# Patient Record
Sex: Female | Born: 1955 | Race: Black or African American | Hispanic: No | Smoking: Never smoker
Health system: Southern US, Community
[De-identification: ages and names within clinical notes are randomized; demographics above are authoritative.]

## PROBLEM LIST (undated history)

## (undated) DIAGNOSIS — I3139 Other pericardial effusion (noninflammatory): Secondary | ICD-10-CM

## (undated) DIAGNOSIS — E7800 Pure hypercholesterolemia, unspecified: Secondary | ICD-10-CM

## (undated) DIAGNOSIS — I2699 Other pulmonary embolism without acute cor pulmonale: Secondary | ICD-10-CM

## (undated) DIAGNOSIS — Z950 Presence of cardiac pacemaker: Secondary | ICD-10-CM

## (undated) DIAGNOSIS — G43909 Migraine, unspecified, not intractable, without status migrainosus: Secondary | ICD-10-CM

## (undated) DIAGNOSIS — M1990 Unspecified osteoarthritis, unspecified site: Secondary | ICD-10-CM

## (undated) DIAGNOSIS — J189 Pneumonia, unspecified organism: Secondary | ICD-10-CM

## (undated) DIAGNOSIS — F419 Anxiety disorder, unspecified: Secondary | ICD-10-CM

## (undated) DIAGNOSIS — E119 Type 2 diabetes mellitus without complications: Secondary | ICD-10-CM

## (undated) DIAGNOSIS — J45909 Unspecified asthma, uncomplicated: Secondary | ICD-10-CM

## (undated) DIAGNOSIS — R001 Bradycardia, unspecified: Secondary | ICD-10-CM

## (undated) DIAGNOSIS — I10 Essential (primary) hypertension: Secondary | ICD-10-CM

## (undated) DIAGNOSIS — I313 Pericardial effusion (noninflammatory): Secondary | ICD-10-CM

## (undated) HISTORY — PX: ABDOMINAL HYSTERECTOMY: SHX81

## (undated) HISTORY — PX: TONSILLECTOMY: SUR1361

## (undated) HISTORY — PX: JOINT REPLACEMENT: SHX530

## (undated) HISTORY — PX: TOTAL KNEE ARTHROPLASTY: SHX125

---

## 1981-12-21 HISTORY — PX: TUBAL LIGATION: SHX77

## 2010-12-21 HISTORY — PX: MULTIPLE TOOTH EXTRACTIONS: SHX2053

## 2013-12-21 HISTORY — PX: INSERT / REPLACE / REMOVE PACEMAKER: SUR710

## 2013-12-21 HISTORY — PX: PERICARDIAL FLUID DRAINAGE: SHX5100

## 2013-12-21 HISTORY — PX: LAPAROSCOPIC CHOLECYSTECTOMY: SUR755

## 2016-08-24 ENCOUNTER — Emergency Department (HOSPITAL_COMMUNITY): Payer: Medicare Other

## 2016-08-24 ENCOUNTER — Observation Stay (HOSPITAL_COMMUNITY)
Admission: EM | Admit: 2016-08-24 | Discharge: 2016-08-27 | Disposition: A | Discharge status: Home / Self Care | Payer: Medicare Other | Attending: Internal Medicine | Admitting: Internal Medicine

## 2016-08-24 ENCOUNTER — Emergency Department (HOSPITAL_BASED_OUTPATIENT_CLINIC_OR_DEPARTMENT_OTHER): Admit: 2016-08-24 | Discharge: 2016-08-24 | Disposition: A | Discharge status: Home / Self Care | Payer: Medicare Other

## 2016-08-24 ENCOUNTER — Encounter (HOSPITAL_COMMUNITY): Payer: Self-pay | Admitting: Vascular Surgery

## 2016-08-24 DIAGNOSIS — Z7901 Long term (current) use of anticoagulants: Secondary | ICD-10-CM | POA: Diagnosis not present

## 2016-08-24 DIAGNOSIS — R079 Chest pain, unspecified: Secondary | ICD-10-CM

## 2016-08-24 DIAGNOSIS — M1990 Unspecified osteoarthritis, unspecified site: Secondary | ICD-10-CM | POA: Diagnosis present

## 2016-08-24 DIAGNOSIS — Z8249 Family history of ischemic heart disease and other diseases of the circulatory system: Secondary | ICD-10-CM | POA: Diagnosis not present

## 2016-08-24 DIAGNOSIS — Z86718 Personal history of other venous thrombosis and embolism: Secondary | ICD-10-CM | POA: Diagnosis not present

## 2016-08-24 DIAGNOSIS — Z7984 Long term (current) use of oral hypoglycemic drugs: Secondary | ICD-10-CM | POA: Diagnosis not present

## 2016-08-24 DIAGNOSIS — Z79899 Other long term (current) drug therapy: Secondary | ICD-10-CM | POA: Diagnosis not present

## 2016-08-24 DIAGNOSIS — E7800 Pure hypercholesterolemia, unspecified: Secondary | ICD-10-CM | POA: Diagnosis not present

## 2016-08-24 DIAGNOSIS — E119 Type 2 diabetes mellitus without complications: Secondary | ICD-10-CM

## 2016-08-24 DIAGNOSIS — R072 Precordial pain: Principal | ICD-10-CM | POA: Diagnosis present

## 2016-08-24 DIAGNOSIS — I10 Essential (primary) hypertension: Secondary | ICD-10-CM | POA: Diagnosis not present

## 2016-08-24 DIAGNOSIS — M79609 Pain in unspecified limb: Secondary | ICD-10-CM

## 2016-08-24 DIAGNOSIS — Z950 Presence of cardiac pacemaker: Secondary | ICD-10-CM | POA: Diagnosis not present

## 2016-08-24 HISTORY — DX: Pericardial effusion (noninflammatory): I31.3

## 2016-08-24 HISTORY — DX: Presence of cardiac pacemaker: Z95.0

## 2016-08-24 HISTORY — DX: Unspecified osteoarthritis, unspecified site: M19.90

## 2016-08-24 HISTORY — DX: Other pulmonary embolism without acute cor pulmonale: I26.99

## 2016-08-24 HISTORY — DX: Morbid (severe) obesity due to excess calories: E66.01

## 2016-08-24 HISTORY — DX: Bradycardia, unspecified: R00.1

## 2016-08-24 HISTORY — DX: Pneumonia, unspecified organism: J18.9

## 2016-08-24 HISTORY — DX: Essential (primary) hypertension: I10

## 2016-08-24 HISTORY — DX: Migraine, unspecified, not intractable, without status migrainosus: G43.909

## 2016-08-24 HISTORY — DX: Pure hypercholesterolemia, unspecified: E78.00

## 2016-08-24 HISTORY — DX: Unspecified asthma, uncomplicated: J45.909

## 2016-08-24 HISTORY — DX: Other pericardial effusion (noninflammatory): I31.39

## 2016-08-24 HISTORY — DX: Anxiety disorder, unspecified: F41.9

## 2016-08-24 HISTORY — DX: Type 2 diabetes mellitus without complications: E11.9

## 2016-08-24 LAB — CBC
HEMATOCRIT: 33 % — AB (ref 36.0–46.0)
HEMOGLOBIN: 10.3 g/dL — AB (ref 12.0–15.0)
MCH: 28.5 pg (ref 26.0–34.0)
MCHC: 31.2 g/dL (ref 30.0–36.0)
MCV: 91.2 fL (ref 78.0–100.0)
Platelets: 245 10*3/uL (ref 150–400)
RBC: 3.62 MIL/uL — ABNORMAL LOW (ref 3.87–5.11)
RDW: 12.6 % (ref 11.5–15.5)
WBC: 4.4 10*3/uL (ref 4.0–10.5)

## 2016-08-24 LAB — I-STAT TROPONIN, ED
TROPONIN I, POC: 0 ng/mL (ref 0.00–0.08)
Troponin i, poc: 0 ng/mL (ref 0.00–0.08)

## 2016-08-24 LAB — BASIC METABOLIC PANEL
ANION GAP: 5 (ref 5–15)
BUN: 15 mg/dL (ref 6–20)
CHLORIDE: 106 mmol/L (ref 101–111)
CO2: 29 mmol/L (ref 22–32)
Calcium: 9 mg/dL (ref 8.9–10.3)
Creatinine, Ser: 0.9 mg/dL (ref 0.44–1.00)
GFR calc Af Amer: >60 mL/min (ref >60)
GFR calc non Af Amer: >60 mL/min (ref >60)
GLUCOSE: 113 mg/dL — AB (ref 65–99)
POTASSIUM: 4.1 mmol/L (ref 3.5–5.1)
Sodium: 140 mmol/L (ref 135–145)

## 2016-08-24 IMAGING — CT CT ANGIO CHEST
1 of 9 series · 13 of 36 positions shown · IV contrast (isovue)
Comparison: Chest radiograph performed earlier today at [DATE] p.m.

CLINICAL DATA: Acute onset of shortness of breath and bilateral
lower leg swelling. Initial encounter.

EXAM:
CT ANGIOGRAPHY CHEST WITH CONTRAST
TECHNIQUE: Multidetector CT imaging of the chest was performed using the
standard protocol during bolus administration of intravenous
contrast. Multiplanar CT image reconstructions and MIPs were
obtained to evaluate the vascular anatomy.
CONTRAST:  80 mL of Isovue 370 IV contrast

[Series 406: thins pacs · axial · 0.59mm/px · z∈[+690,+912]mm · 13 of 260 slices shown]
[im 19/260  lung]
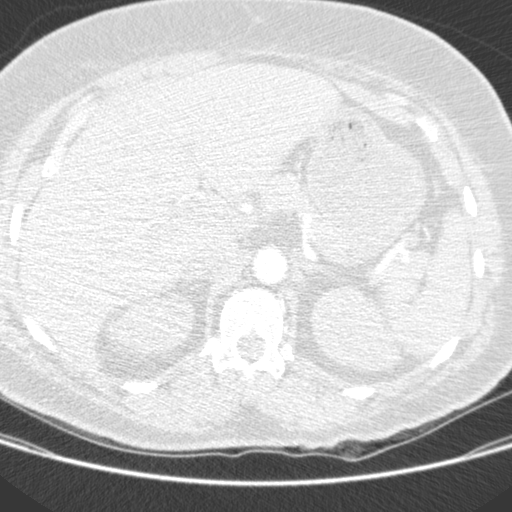
[im 38/260  mediastinal]
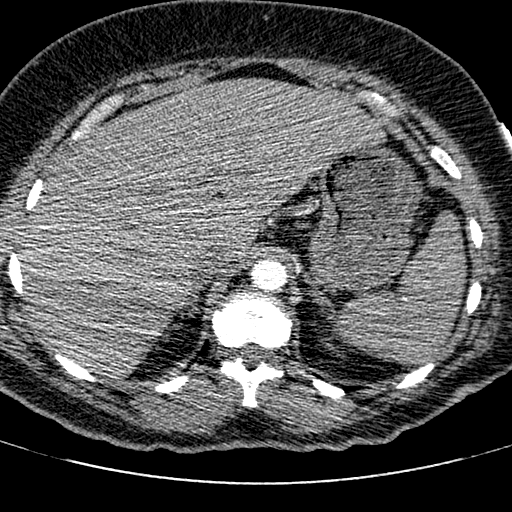
[im 56/260  lung]
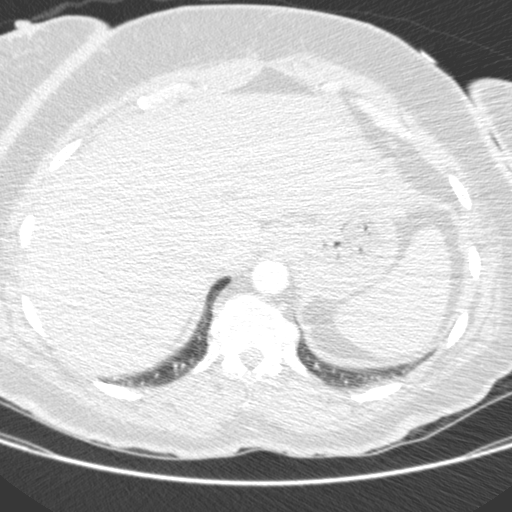
[im 75/260  mediastinal]
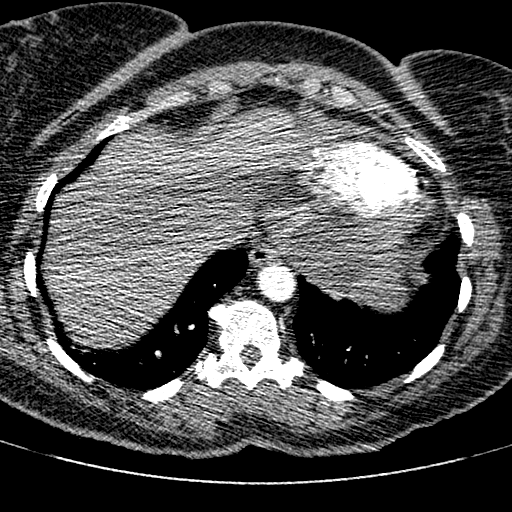
[im 93/260  lung]
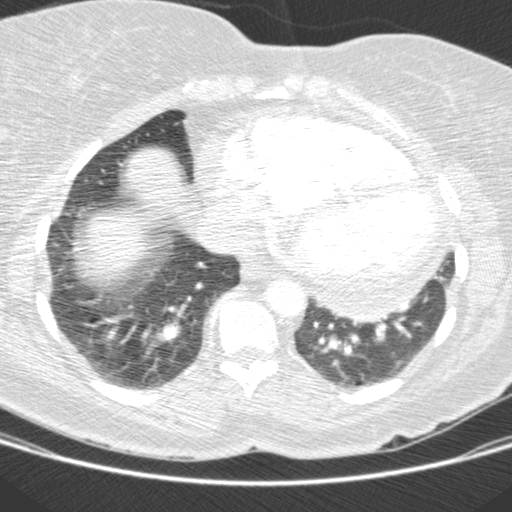
[im 112/260  mediastinal]
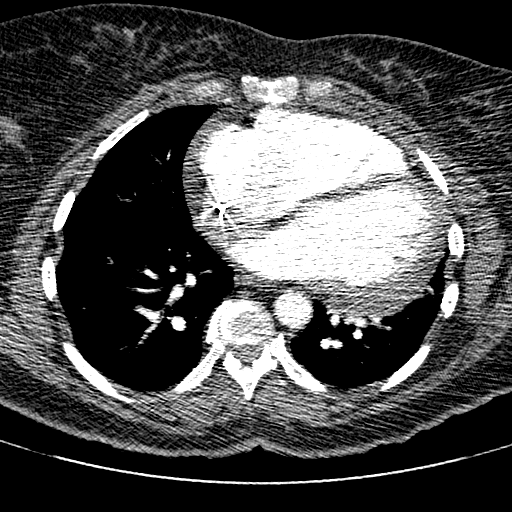
[im 130/260  lung]
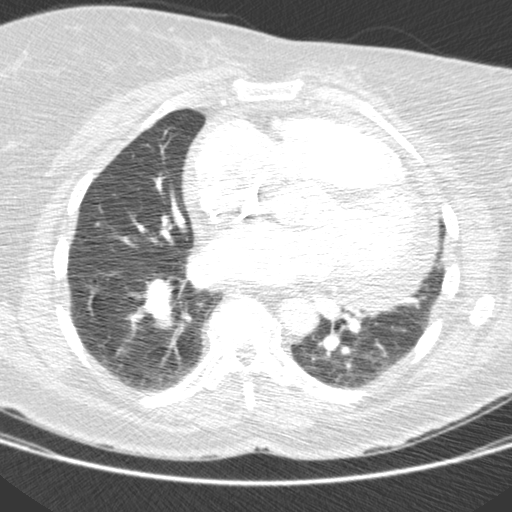
[im 149/260  mediastinal]
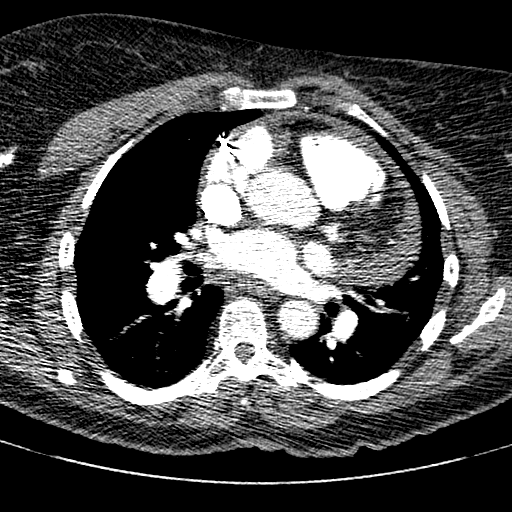
[im 167/260  lung]
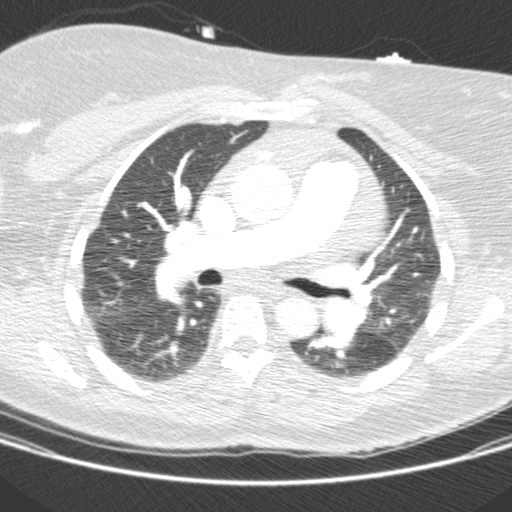
[im 186/260  mediastinal]
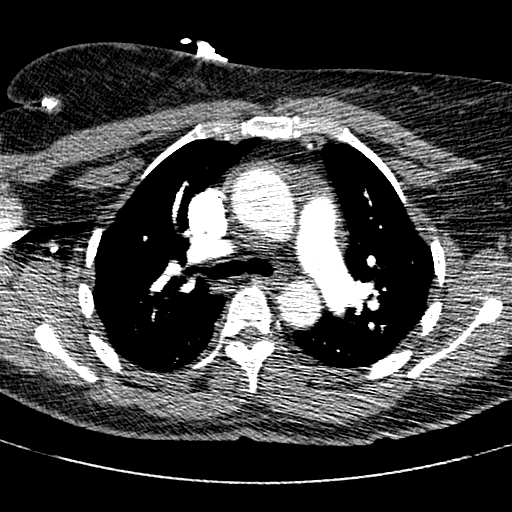
[im 204/260  lung]
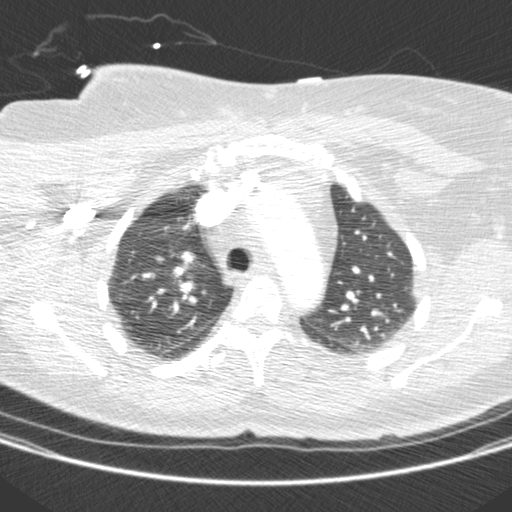
[im 223/260  mediastinal]
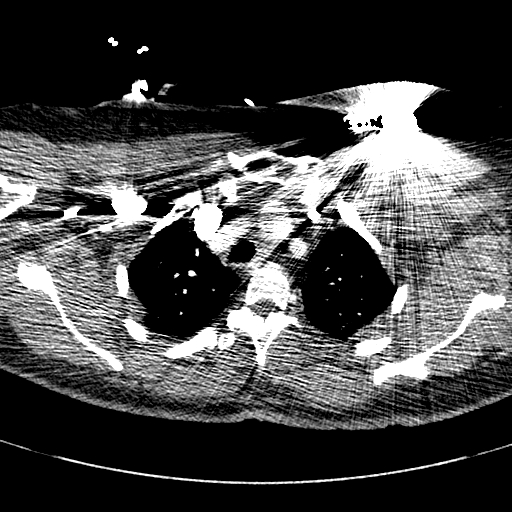
[im 241/260  lung]
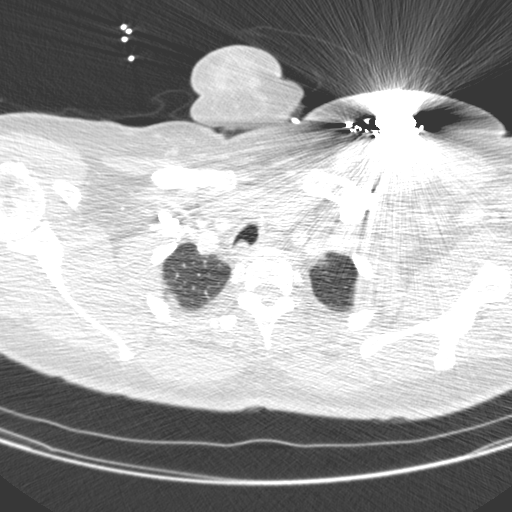

[13 of 36 positions shown; findings below may reference images not displayed]

FINDINGS: There is no evidence of pulmonary embolus.

The lungs are essentially clear bilaterally. There is no evidence of
significant focal consolidation, pleural effusion or pneumothorax.
No masses are identified; no abnormal focal contrast enhancement is
seen.

The heart is enlarged, with a small pericardial effusion. Pacemaker
leads are noted ending at the right atrium and right ventricle, with
the pacemaker at the left chest wall. No mediastinal lymphadenopathy
is seen. The great vessels are grossly unremarkable in appearance.
No axillary lymphadenopathy is seen. The visualized portions of the
thyroid gland are unremarkable in appearance.

The visualized portions of the liver and spleen are unremarkable.
The visualized portions of the pancreas, stomach, adrenal glands and
left kidney are within normal limits. The patient is status post
cholecystectomy, with clips noted at the gallbladder fossa. An
apparent 3.2 cm right renal cyst is noted.

No acute osseous abnormalities are seen.

Review of the MIP images confirms the above findings.
IMPRESSION: 1. No evidence of pulmonary embolus.
2. Lungs clear bilaterally.
3. Cardiomegaly, with a small pericardial effusion.
4. Right renal cyst noted.

## 2016-08-24 MED ORDER — IOPAMIDOL (ISOVUE-370) INJECTION 76%
INTRAVENOUS | Status: AC
  Administered 2016-08-24: 80 mL
  Filled 2016-08-24: qty 100

## 2016-08-24 MED ORDER — OXYCODONE-ACETAMINOPHEN 5-325 MG PO TABS
1.0000 | ORAL_TABLET | Freq: Once | ORAL | Status: AC
  Administered 2016-08-24: 1 via ORAL
  Filled 2016-08-24: qty 1

## 2016-08-24 MED ORDER — MORPHINE SULFATE (PF) 4 MG/ML IV SOLN
4.0000 mg | Freq: Once | INTRAVENOUS | Status: AC
Start: 2016-08-24 — End: 2016-08-24
  Administered 2016-08-24: 4 mg via INTRAVENOUS
  Filled 2016-08-24: qty 1

## 2016-08-24 MED ORDER — LORAZEPAM 1 MG PO TABS
0.5000 mg | ORAL_TABLET | Freq: Once | ORAL | Status: AC
  Administered 2016-08-24: 0.5 mg via ORAL
  Filled 2016-08-24: qty 1

## 2016-08-24 MED ORDER — MORPHINE SULFATE (PF) 4 MG/ML IV SOLN
4.0000 mg | Freq: Once | INTRAVENOUS | Status: AC
  Administered 2016-08-24: 4 mg via INTRAVENOUS
  Filled 2016-08-24: qty 1

## 2016-08-24 NOTE — ED Notes (Signed)
EDP at bedside  

## 2016-08-24 NOTE — H&P (Signed)
History and Physical    Maureen Adams EZM:[id number] DOB: [birth year] DOA: 08/24/2016  Referring MD/NP/PA:   PCP: No primary care provider on file.   Patient coming from:  The patient is coming from home ([place]).  At baseline, pt is independent for most of ADL  Chief Complaint: chest pain  HPI: Maureen Adams is a 60 y.o. female with medical history significant of pacemaker placement, hypertension, hyperlipidemia, diabetes mellitus, pericardial effusion 20/15 required pericardial fluid drainage, arthritis, possible recent left arm DVT, who presents with chest pain.  Patient reports that she has been having chest pain for 4 days. Her chest pain is located in the left lower chest, constant, sharp, 8 out of 10 in severity, radiating to the left back. It is aggravated by deep breath. It is associated with shortness of breath. No fever or chill. No coughing. She states that she traveled to the [facility] on Saturday by driving and drove back today. She has mild tenderness over right calf area.   Pt state that she was diagnosed with "clot behind her pacemaker due to left arm swelling" two weeks ago". She was started on blood thinner which is started with "Eq or something" two weeks ago by her cardiologist, Dr. Cyndie Mull ([facility], [place]. Tele [phone]). When asked if the blood thinner is Eliquis, she could not confirm it. Currently, she dose not have left arm pain or swelling. Patient denies nausea, vomiting, diarrhea, abdominal pain, symptoms of UTI or unilateral weakness.  ED Course: pt was found to have negative troponin, WBC 4.4, temperature normal, no tachycardia, no tachypnea, oxygen saturation 94% on room air, electrolytes and renal function okay. Chest x-ray showed vascular congestion, but no infiltration. CT angiogram is negative for PE, but showed small pericardial effusion and right renal cyst. Right LE doppler is negative for DVT. Pt is placed on tele bed for  obs.  Review of Systems:   General: no fevers, chills, no changes in body weight, has fatigue HEENT: no blurry vision, hearing changes or sore throat Respiratory: has dyspnea, no coughing, wheezing CV: has chest pain, no palpitations GI: no nausea, vomiting, abdominal pain, diarrhea, constipation GU: no dysuria, burning on urination, increased urinary frequency, hematuria  Ext: no leg edema. Has right calf pain. Neuro: no unilateral weakness, numbness, or tingling, no vision change or hearing loss Skin: no rash, no skin tear. MSK: No muscle spasm, no deformity, no limitation of range of movement in spin Heme: No easy bruising.  Travel history: No recent long distant travel.  Allergy:  Allergies  Allergen Reactions  . Banana     Throat closing  . Latex     rash  . Seldane [Terfenadine]     Rash     Past Medical History:  Diagnosis Date  . Arthritis   . Diabetes mellitus without complication ([place])   . Hypercholesteremia   . Hypertension   . Pacemaker   . Pericardial effusion     Past Surgical History:  Procedure Laterality Date  . ABDOMINAL HYSTERECTOMY    . CESAREAN SECTION    . CHOLECYSTECTOMY    . MOUTH SURGERY    . PACEMAKER INSERTION    . PERICARDIAL FLUID DRAINAGE    . REPLACEMENT TOTAL KNEE      Social History:  reports that she has never smoked. She has never used smokeless tobacco. She reports that she does not drink alcohol or use drugs.  Family History:  Family History  Problem Relation Age of Onset  .  Hypertension Mother   . Heart disease Mother   . Diabetes Mellitus I Sister      Prior to Admission medications   Medication Sig Start Date End Date Taking? Authorizing Provider  atorvastatin (LIPITOR) 40 MG tablet Take 40 mg by mouth daily.   Yes Historical Provider, MD  cyclobenzaprine (FLEXERIL) 10 MG tablet Take 10 mg by mouth 3 (three) times daily as needed for muscle spasms.   Yes Historical Provider, MD  metFORMIN (GLUCOPHAGE) 500 MG  tablet Take 500 mg by mouth 2 (two) times daily with a meal.   Yes Historical Provider, MD  metoprolol tartrate (LOPRESSOR) 25 MG tablet Take 25 mg by mouth 2 (two) times daily.   Yes Historical Provider, MD  traMADol (ULTRAM) 50 MG tablet Take 50 mg by mouth every 6 (six) hours as needed for moderate pain.   Yes Historical Provider, MD  venlafaxine XR (EFFEXOR-XR) 150 MG 24 hr capsule Take 150 mg by mouth daily with breakfast.   Yes Historical Provider, MD    Physical Exam: Vitals:   08/24/16 2330 08/24/16 2345 08/25/16 0000 08/25/16 0030  BP: 114/78 124/76 106/95 111/66  Pulse: 63 72 63 62  Resp: 15 18 17 18  Temp:      TempSrc:      SpO2: 95% 95% (!) 88% 95%   General: Not in acute distress HEENT:       Eyes: PERRL, EOMI, no scleral icterus.       ENT: No discharge from the ears and nose, no pharynx injection, no tonsillar enlargement.        Neck: No JVD, no bruit, no mass felt. Heme: No neck lymph node enlargement. Cardiac: S1/S2, RRR, No murmurs, No gallops or rubs. Respiratory: No rales, wheezing, rhonchi or rubs. GI: Soft, nondistended, nontender, no rebound pain, no organomegaly, BS present. GU: No hematuria. Ext: No pitting leg edema bilaterally. 2+DP/PT pulse bilaterally. Musculoskeletal: No joint deformities, No joint redness or warmth, no limitation of ROM in spin. Skin: No rashes. No skin tear Neuro: Alert, oriented X3, cranial nerves II-XII grossly intact, moves all extremities normally.  Psych: Patient is not psychotic, no suicidal or hemocidal ideation.  Labs on Admission: I have personally reviewed following labs and imaging studies  CBC:  Recent Labs Lab 08/24/16 1720  WBC 4.4  HGB 10.3*  HCT 33.0*  MCV 91.2  PLT 245   Basic Metabolic Panel:  Recent Labs Lab 08/24/16 1720  NA 140  K 4.1  CL 106  CO2 29  GLUCOSE 113*  BUN 15  CREATININE 0.90  CALCIUM 9.0   GFR: CrCl cannot be calculated (Unknown ideal weight.). Liver Function Tests: No  results for input(s): AST, ALT, ALKPHOS, BILITOT, PROT, ALBUMIN in the last 168 hours. No results for input(s): LIPASE, AMYLASE in the last 168 hours. No results for input(s): AMMONIA in the last 168 hours. Coagulation Profile:  Recent Labs Lab 08/25/16 0025  INR 1.11   Cardiac Enzymes: No results for input(s): CKTOTAL, CKMB, CKMBINDEX, TROPONINI in the last 168 hours. BNP (last 3 results) No results for input(s): PROBNP in the last 8760 hours. HbA1C: No results for input(s): HGBA1C in the last 72 hours. CBG: No results for input(s): GLUCAP in the last 168 hours. Lipid Profile: No results for input(s): CHOL, HDL, LDLCALC, TRIG, CHOLHDL, LDLDIRECT in the last 72 hours. Thyroid Function Tests: No results for input(s): TSH, T4TOTAL, FREET4, T3FREE, THYROIDAB in the last 72 hours. Anemia Panel: No results for input(s): VITAMINB12, FOLATE,   FERRITIN, TIBC, IRON, RETICCTPCT in the last 72 hours. Urine analysis: No results found for: COLORURINE, APPEARANCEUR, LABSPEC, PHURINE, GLUCOSEU, HGBUR, BILIRUBINUR, KETONESUR, PROTEINUR, UROBILINOGEN, NITRITE, LEUKOCYTESUR Sepsis Labs: @LABRCNTIP(procalcitonin:4,lacticidven:4) )No results found for this or any previous visit (from the past 240 hour(s)).   Radiological Exams on Admission: Dg Chest 2 View  Result Date: 08/24/2016 CLINICAL DATA:  60-year-old female with chest pain and shortness of breath EXAM: CHEST  2 VIEW COMPARISON:  None. FINDINGS: Two views of the chest do not demonstrate focal consolidation. There is no pleural effusion or pneumothorax. There is moderate cardiomegaly with mild central vascular prominence, compatible with mild congestion. Left pectoral dual lead pacemaker device. No acute osseous pathology identified. Right upper quadrant cholecystectomy clips. IMPRESSION: Moderate cardiomegaly with mild vascular congestion. No focal consolidation. Electronically Signed   By: Arash  Radparvar M.D.   On: 08/24/2016 18:23   Ct Angio  Chest Pe W And/or Wo Contrast  Result Date: 08/24/2016 CLINICAL DATA:  Acute onset of shortness of breath and bilateral lower leg swelling. Initial encounter. EXAM: CT ANGIOGRAPHY CHEST WITH CONTRAST TECHNIQUE: Multidetector CT imaging of the chest was performed using the standard protocol during bolus administration of intravenous contrast. Multiplanar CT image reconstructions and MIPs were obtained to evaluate the vascular anatomy. CONTRAST:  80 mL of Isovue 370 IV contrast COMPARISON:  Chest radiograph performed earlier today at 5:47 p.m. FINDINGS: There is no evidence of pulmonary embolus. The lungs are essentially clear bilaterally. There is no evidence of significant focal consolidation, pleural effusion or pneumothorax. No masses are identified; no abnormal focal contrast enhancement is seen. The heart is enlarged, with a small pericardial effusion. Pacemaker leads are noted ending at the right atrium and right ventricle, with the pacemaker at the left chest wall. No mediastinal lymphadenopathy is seen. The great vessels are grossly unremarkable in appearance. No axillary lymphadenopathy is seen. The visualized portions of the thyroid gland are unremarkable in appearance. The visualized portions of the liver and spleen are unremarkable. The visualized portions of the pancreas, stomach, adrenal glands and left kidney are within normal limits. The patient is status post cholecystectomy, with clips noted at the gallbladder fossa. An apparent 3.2 cm right renal cyst is noted. No acute osseous abnormalities are seen. Review of the MIP images confirms the above findings. IMPRESSION: 1. No evidence of pulmonary embolus. 2. Lungs clear bilaterally. 3. Cardiomegaly, with a small pericardial effusion. 4. Right renal cyst noted. Electronically Signed   By: Jeffery  Chang M.D.   On: 08/24/2016 23:20     EKG: Independently reviewed.  Sinus rhythm, QTC 413, LAD, mild T-wave inversion in V1-V3, no infiltrates, ?PR  segment elevation in inferior leads.   Assessment/Plan Principal Problem:   Chest pain Active Problems:   Pacemaker   Hypercholesteremia   Diabetes mellitus without complication (HCC)   Arthritis   Chest pain: Etiology is not clear. CT angiogram is negative for PE. No DVT by right LE doppler. No infiltrates by chest x-ray. 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 His pleuritic chest pain nature is consistent with this possibility. Given her significant risk factors including hypertension, hyperlipidemia and diabetes mellitus, will also need to rule out ACS. Pt dose not have JVD, blood pressure normal. No signs of tamponade.  - will place on Tele bed for obs - start colchicine 0.6 mg bid. Will not start NSIADs since pt is taking blood thinner. This plan is discussed with pharmacist on-call. - check   CRP and ESR - prn percocet and morphine for pain - cycle CE q6 x3 and repeat her EKG in the am  - continue lipitor  - Risk factor stratification: will check FLP, UDS and A1C  - 2d echo - please call Card in AM - will request [place] patient's cardiologist, Dr. Cyndie Mull, [facility], [place]. Tele [phone].  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 Patient is taking metformin at home. Blood sugar 113 -SSI -Check A1c  Hx of possible left DVT: pt could not give clear hx and exact name of blood thinner. She states that she took the AM dose of blood thinner. -will hold off blood thinner until medical record is available. This plan is discussed with pharmacist on-call.  DVT ppx: SQ Heparin (if pt develops severe chest pain or significantly elevated trop, will be easier to switch to IV heparin or stop heparin for procedure than using Lovenox).  Code Status: Full code Family Communication: Yes, patient's husband at bed side Disposition  Plan:  Anticipate discharge back to previous home environment Consults called:  none Admission status: Obs / tele    Date of Service 08/25/2016    Ivor Costa [facility] Pager [phone]  If 7PM-7AM, please contact night-coverage [email or web address] Password [email or web address] 08/25/2016, 12:52 AM

## 2016-08-24 NOTE — ED Notes (Signed)
Patient transported to X-ray 

## 2016-08-24 NOTE — ED Notes (Signed)
Attempted IV 1x. Transporter for vascular US here. Pt. Agrees to PO pain meds until she returns from US.

## 2016-08-24 NOTE — ED Provider Notes (Signed)
Emergency Department Provider Note   I have reviewed the triage vital signs and the nursing notes.   HISTORY  Chief Complaint Chest Pain and Shortness of Breath   HPI Maureen Adams is a 60 y.o. 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 Symptoms began suddenly yesterday and have been persistent over the last 24 hours. She describes the pain as central and worse with deep breathing. Denies radiation to the abdomen. Does have some pain in the back. Patient reports that this does not feel similar to prior pericardial effusion pain as that was more dyspnea. She reports having a known left upper extremity clot and was started on Eliquis several weeks ago. She has been compliant with that medication. No fevers, productive cough, vomiting, diarrhea.   Patient is also endorsing some new onset pain in her right calf and behind her knee. She reports some mild swelling in this area. Associated rash.   Past Medical History:  Diagnosis Date  . Arthritis   . Diabetes mellitus without complication (HCC)   . Hypercholesteremia   . Hypertension   . Pacemaker   . Pericardial effusion     There are no active problems to display for this patient.   Past Surgical History:  Procedure Laterality Date  . ABDOMINAL HYSTERECTOMY    . CESAREAN SECTION    . CHOLECYSTECTOMY    . MOUTH SURGERY    . PACEMAKER INSERTION    . PERICARDIAL FLUID DRAINAGE    . REPLACEMENT TOTAL KNEE        Allergies Review of patient's allergies indicates not on file.  No family history on file.  Social History Social History  Substance Use Topics  . Smoking status: Never Smoker  . Smokeless tobacco: Never Used  . Alcohol use No    Review of Systems  Constitutional: No fever/chills Eyes: No visual changes. ENT: No sore  throat. Cardiovascular: Positive chest pain. Respiratory: Positive shortness of breath. Gastrointestinal: No abdominal pain.  No nausea, no vomiting.  No diarrhea.  No constipation. Genitourinary: Negative for dysuria. Musculoskeletal: Negative for back pain. Skin: Negative for rash. Neurological: Negative for headaches, focal weakness or numbness.  10-point ROS otherwise negative.  ____________________________________________   PHYSICAL EXAM:  VITAL SIGNS: ED Triage Vitals  Enc Vitals Group     BP 08/24/16 1718 118/72     Pulse Rate 08/24/16 1718 62     Resp 08/24/16 1718 16     Temp 08/24/16 1718 98.4 F (36.9 C)     Temp Source 08/24/16 1718 Oral     SpO2 08/24/16 1718 99 %     Pain Score 08/24/16 1714 8   Constitutional: Alert and oriented. Appears uncomfortable.  Eyes: Conjunctivae are normal.  Head: Atraumatic. Nose: No congestion/rhinnorhea. Mouth/Throat: Mucous membranes are moist.  Oropharynx non-erythematous. Neck: No stridor.  Cardiovascular: Normal rate, regular rhythm. Good peripheral circulation. Grossly normal heart sounds.   Respiratory: Normal respiratory effort.  No retractions. Lungs CTAB. Gastrointestinal: Soft and nontender. No distention.  Musculoskeletal: No lower extremity tenderness nor edema. No gross deformities of extremities. Neurologic:  Normal speech and language. No gross focal neurologic deficits are appreciated.  Skin:  Skin is warm, dry and intact. No rash noted. Psychiatric: Mood and affect are normal. Speech and behavior are normal.  ____________________________________________   LABS (all labs ordered are listed, but  only abnormal results are displayed)  Labs Reviewed  BASIC METABOLIC PANEL - Abnormal; Notable for the following:       Result Value   Glucose, Bld 113 (*)    All other components within normal limits  CBC - Abnormal; Notable for the following:    RBC 3.62 (*)    Hemoglobin 10.3 (*)    HCT 33.0 (*)    All other  components within normal limits  I-STAT TROPOININ, ED   ____________________________________________  EKG   EKG Interpretation  Date/Time:  Monday August 24 2016 17:57:55 EDT Ventricular Rate:  75 PR Interval:  160 QRS Duration: 84 QT Interval:  406 QTC Calculation: 453 R Axis:   -6 Text Interpretation:  Sinus rhythm with marked sinus arrhythmia Cannot rule out Anterior infarct , age undetermined Abnormal ECG No STEMI. Compared to prior tracings.  Confirmed by Colie Josten MD, Mahamadou Weltz [phone]) on 08/24/2016 6:29:41 PM       ____________________________________________  RADIOLOGY  Dg Chest 2 View  Result Date: 08/24/2016 CLINICAL DATA:  60 year old female with chest pain and shortness of breath EXAM: CHEST  2 VIEW COMPARISON:  None. FINDINGS: Two views of the chest do not demonstrate focal consolidation. There is no pleural effusion or pneumothorax. There is moderate cardiomegaly with mild central vascular prominence, compatible with mild congestion. Left pectoral dual lead pacemaker device. No acute osseous pathology identified. Right upper quadrant cholecystectomy clips. IMPRESSION: Moderate cardiomegaly with mild vascular congestion. No focal consolidation. Electronically Signed   By: Elgie CollardArash  Radparvar M.D.   On: 08/24/2016 18:23    ____________________________________________   PROCEDURES  Procedure(s) performed:   Procedures  US guided IV placement  Discussed risk and benefit of US guided IV placement. Patient verbally agrees to procedure. Placed 20 G IV in the right AC under ultrasound guidance. Line flushes and draws back easily. No immediate complication.  ____________________________________________   INITIAL IMPRESSION / ASSESSMENT AND PLAN / ED COURSE  Pertinent labs & imaging results that were available during my care of the patient were reviewed by me and considered in my medical decision making (see chart for details).  Patient resents to the emergency department  for evaluation of sudden onset chest pain and dyspnea. She has prior history of DVT and was recently started on Eliquis for left upper extremity DVT. She also has history of large pericardial effusion that required drainage. Baseline labs and chest x-ray are unremarkable. Plan for CT scan of the chest to rule out pulmonary embolism. We'll also obtain right lower extremity Doppler with new onset pain in that area although development of new DVT while on Eliquis is less likely.   10:01 PM Placed ultrasound-guided IV. Plan for additional IV pain medication and CTA.  11:27 PM Second troponin being drawn now. There was a delay because of difficult IV access as described above and delayed CTA. Paged medicine team to discuss admission, enzyme trending, and ECHO to assess pericardial effusion.   Temporary orders placed after discussion with hospitalist.  ____________________________________________  FINAL CLINICAL IMPRESSION(S) / ED DIAGNOSES  Final diagnoses:  Chest pain, unspecified chest pain type  Pacemaker  Essential hypertension  Hypercholesteremia  Diabetes mellitus without complication (HCC)  Arthritis     MEDICATIONS GIVEN DURING THIS VISIT:  Medications  morphine 4 MG/ML injection 4 mg (not administered)  iopamidol (ISOVUE-370) 76 % injection (not administered)  oxyCODONE-acetaminophen (PERCOCET/ROXICET) 5-325 MG per tablet 1 tablet (not administered)     NEW OUTPATIENT MEDICATIONS STARTED DURING THIS VISIT:  None  Note:  This document was prepared using Dragon voice recognition software and may include unintentional dictation errors.  Alona Bene, MD Emergency Medicine   Maia Plan, MD 08/25/16 1140

## 2016-08-24 NOTE — ED Triage Notes (Signed)
Pt reports to the ED for eval of CP and SOB. Pt reports the symptoms began yesterday. She has a pacemaker to the left chest. She reports the pain is worse with deep breaths and to palpation. Pt was dx with a blood clot around the area of her pacemaker but she cannot remember where. Also having some BLE swelling.

## 2016-08-24 NOTE — Progress Notes (Signed)
*  Preliminary Results* Right lower extremity venous duplex completed. Right lower extremity is negative for deep vein thrombosis. There is no evidence of right Baker's cyst.  08/24/2016 6:43 PM  Gertie FeyMichelle Iesha Summerhill, BS, RVT, RDCS, RDMS

## 2016-08-25 ENCOUNTER — Encounter (HOSPITAL_COMMUNITY): Payer: Self-pay | Admitting: Internal Medicine

## 2016-08-25 DIAGNOSIS — M1990 Unspecified osteoarthritis, unspecified site: Secondary | ICD-10-CM | POA: Diagnosis not present

## 2016-08-25 DIAGNOSIS — E7800 Pure hypercholesterolemia, unspecified: Secondary | ICD-10-CM | POA: Diagnosis not present

## 2016-08-25 DIAGNOSIS — I10 Essential (primary) hypertension: Secondary | ICD-10-CM | POA: Diagnosis not present

## 2016-08-25 DIAGNOSIS — R072 Precordial pain: Secondary | ICD-10-CM

## 2016-08-25 DIAGNOSIS — Z950 Presence of cardiac pacemaker: Secondary | ICD-10-CM

## 2016-08-25 DIAGNOSIS — E119 Type 2 diabetes mellitus without complications: Secondary | ICD-10-CM | POA: Diagnosis not present

## 2016-08-25 DIAGNOSIS — R071 Chest pain on breathing: Secondary | ICD-10-CM

## 2016-08-25 DIAGNOSIS — I319 Disease of pericardium, unspecified: Secondary | ICD-10-CM

## 2016-08-25 LAB — PROTIME-INR
INR: 1.11
Prothrombin Time: 14.3 seconds (ref 11.4–15.2)

## 2016-08-25 LAB — SEDIMENTATION RATE: Sed Rate: 20 mm/hr (ref 0–22)

## 2016-08-25 LAB — GLUCOSE, CAPILLARY
GLUCOSE-CAPILLARY: 143 mg/dL — AB (ref 65–99)
GLUCOSE-CAPILLARY: 93 mg/dL (ref 65–99)
Glucose-Capillary: 85 mg/dL (ref 65–99)
Glucose-Capillary: 185 mg/dL — ABNORMAL HIGH (ref 65–99)
Glucose-Capillary: 104 mg/dL — ABNORMAL HIGH (ref 65–99)

## 2016-08-25 LAB — RAPID URINE DRUG SCREEN, HOSP PERFORMED
AMPHETAMINES: NOT DETECTED
BARBITURATES: NOT DETECTED
Benzodiazepines: NOT DETECTED
Cocaine: NOT DETECTED
OPIATES: POSITIVE — AB
TETRAHYDROCANNABINOL: NOT DETECTED

## 2016-08-25 LAB — LIPID PANEL
CHOL/HDL RATIO: 2.6 ratio
Cholesterol: 140 mg/dL (ref 0–200)
HDL: 53 mg/dL (ref >40)
LDL Cholesterol: 50 mg/dL (ref 0–99)
Triglycerides: 184 mg/dL — ABNORMAL HIGH (ref <150)
VLDL: 37 mg/dL (ref 0–40)

## 2016-08-25 LAB — TROPONIN I

## 2016-08-25 LAB — C-REACTIVE PROTEIN: CRP: 0.9 mg/dL (ref <1.0)

## 2016-08-25 MED ORDER — OXYCODONE-ACETAMINOPHEN 5-325 MG PO TABS
1.0000 | ORAL_TABLET | ORAL | Status: DC | PRN
  Administered 2016-08-25 – 2016-08-27 (×7): 1 via ORAL
  Filled 2016-08-25 (×7): qty 1

## 2016-08-25 MED ORDER — ALPRAZOLAM 0.25 MG PO TABS
0.2500 mg | ORAL_TABLET | Freq: Two times a day (BID) | ORAL | Status: DC | PRN
  Administered 2016-08-27: 0.25 mg via ORAL
  Filled 2016-08-25: qty 1

## 2016-08-25 MED ORDER — KETOROLAC TROMETHAMINE 15 MG/ML IJ SOLN
15.0000 mg | Freq: Once | INTRAMUSCULAR | Status: AC
Start: 2016-08-25 — End: 2016-08-25
  Administered 2016-08-25: 15 mg via INTRAVENOUS
  Filled 2016-08-25: qty 1

## 2016-08-25 MED ORDER — VENLAFAXINE HCL ER 75 MG PO CP24
150.0000 mg | ORAL_CAPSULE | Freq: Every day | ORAL | Status: DC
  Administered 2016-08-25 – 2016-08-27 (×3): 150 mg via ORAL
  Filled 2016-08-25 (×3): qty 2

## 2016-08-25 MED ORDER — CYCLOBENZAPRINE HCL 10 MG PO TABS: 10.0000 mg | ORAL_TABLET | Freq: Three times a day (TID) | ORAL | Status: DC | PRN

## 2016-08-25 MED ORDER — ONDANSETRON HCL 4 MG/2ML IJ SOLN: 4.0000 mg | Freq: Four times a day (QID) | INTRAMUSCULAR | Status: DC | PRN

## 2016-08-25 MED ORDER — INSULIN ASPART 100 UNIT/ML ~~LOC~~ SOLN: 0.0000 [IU] | Freq: Every day | SUBCUTANEOUS | Status: DC

## 2016-08-25 MED ORDER — COLCHICINE 0.6 MG PO TABS
0.6000 mg | ORAL_TABLET | Freq: Two times a day (BID) | ORAL | Status: DC
  Administered 2016-08-25 – 2016-08-27 (×5): 0.6 mg via ORAL
  Filled 2016-08-25 (×5): qty 1

## 2016-08-25 MED ORDER — HYDROMORPHONE HCL 1 MG/ML IJ SOLN
INTRAMUSCULAR | Status: AC
  Filled 2016-08-25: qty 1

## 2016-08-25 MED ORDER — ZOLPIDEM TARTRATE 5 MG PO TABS
5.0000 mg | ORAL_TABLET | Freq: Every evening | ORAL | Status: DC | PRN
  Administered 2016-08-25 – 2016-08-26 (×2): 5 mg via ORAL
  Filled 2016-08-25 (×2): qty 1

## 2016-08-25 MED ORDER — ACETAMINOPHEN 325 MG PO TABS
650.0000 mg | ORAL_TABLET | ORAL | Status: DC | PRN
  Filled 2016-08-25: qty 2

## 2016-08-25 MED ORDER — METOPROLOL TARTRATE 25 MG PO TABS
25.0000 mg | ORAL_TABLET | Freq: Two times a day (BID) | ORAL | Status: DC
  Administered 2016-08-25 – 2016-08-27 (×5): 25 mg via ORAL
  Filled 2016-08-25 (×5): qty 1

## 2016-08-25 MED ORDER — ATORVASTATIN CALCIUM 40 MG PO TABS
40.0000 mg | ORAL_TABLET | Freq: Every day | ORAL | Status: DC
  Administered 2016-08-25 – 2016-08-27 (×3): 40 mg via ORAL
  Filled 2016-08-25 (×3): qty 1

## 2016-08-25 MED ORDER — MORPHINE SULFATE (PF) 2 MG/ML IV SOLN
2.0000 mg | INTRAVENOUS | Status: DC | PRN
  Administered 2016-08-25 (×2): 2 mg via INTRAVENOUS
  Filled 2016-08-25 (×2): qty 1

## 2016-08-25 MED ORDER — HYDROMORPHONE HCL 1 MG/ML IJ SOLN
1.0000 mg | Freq: Once | INTRAMUSCULAR | Status: AC
  Administered 2016-08-25: 1 mg via INTRAVENOUS

## 2016-08-25 MED ORDER — INSULIN ASPART 100 UNIT/ML ~~LOC~~ SOLN
0.0000 [IU] | Freq: Three times a day (TID) | SUBCUTANEOUS | Status: DC
  Administered 2016-08-25 – 2016-08-26 (×2): 2 [IU] via SUBCUTANEOUS

## 2016-08-25 MED ORDER — HEPARIN SODIUM (PORCINE) 5000 UNIT/ML IJ SOLN
5000.0000 [IU] | Freq: Three times a day (TID) | INTRAMUSCULAR | Status: DC
Start: 2016-08-25 — End: 2016-08-26
  Administered 2016-08-25 – 2016-08-26 (×4): 5000 [IU] via SUBCUTANEOUS
  Filled 2016-08-25 (×4): qty 1

## 2016-08-25 NOTE — Progress Notes (Signed)
Patient lying in bed, no needs at this time. Call light within reach. 

## 2016-08-25 NOTE — Consult Note (Signed)
Cardiology Consult    Patient ID: Maureen Adams MRN: [id number], DOB/AGE: 04-08-1956   Admit date: 08/24/2016 Date of Consult: 08/25/2016  Primary Physician: No primary care provider on file. Primary Cardiologist: B. Rockne Menghini, MD - [facility] Requesting Provider: Judie Petit. Elmahi  Patient Profile    60 year old female with a history of symptomatic bradycardia status post permanent pacemaker in 2015 complicated by pericardial effusion req window 2 months later, who was recently dx with LUE DVT and presented yesterday with a 2 wk h/o chest pain.   Past Medical History   Past Medical History:  Diagnosis Date  . Arthritis   . Diabetes mellitus without complication (HCC)   . Hypercholesteremia   . Hypertension   . Morbid obesity (HCC)   . Pericardial effusion    a. 11/2014 s/p window in [place], [place].  Marland Kitchen Symptomatic Bradycardia    a. 09/2014 s/p PPM [place], [place]).    Past Surgical History:  Procedure Laterality Date  . ABDOMINAL HYSTERECTOMY    . CESAREAN SECTION    . CHOLECYSTECTOMY    . MOUTH SURGERY    . PACEMAKER INSERTION    . PERICARDIAL FLUID DRAINAGE    . REPLACEMENT TOTAL KNEE       Allergies  Allergies  Allergen Reactions  . Banana     Throat closing  . Latex     rash  . Seldane [Terfenadine]     Rash     History of Present Illness    60 year old female with the above, plus past medical history. She has a long-term history of hypertension, hyperlipidemia, diabetes, and obesity. In 2015, she developed somatic bradycardia and in October 2015, underwent successful permanent pacemaker placement in [place]. She says in November 2015, she developed cholecystitis requiring cholecystectomy. In December 2015, she had severe pleuritic chest pain and was found to have what sounds like a large pericardial effusion. This required placement of a pericardial window. She says that she recovered recently well from that and over the past 2 years has been  followed by cardiology in [place]. She has some degree of chronic dyspnea exertion with higher levels of activity.  Approximately 2 weeks ago, she noted significant left upper extremity swelling and venous congestion. She was seen by her cardiologist and was diagnosed with a left upper extremity DVT. She was placed on eliquis and says she has been compliant. Shortly thereafter, she began to experience intermittent rest and exertional substernal and left-sided chest discomfort that was worse with deep breathing and palpation along the left sternal border and under her left breast. Pain could worsen at times also with position changes but was not specifically any better or worse with lying down or sitting up. She didn't think too much discomfort and went away to [facility] where she said pain remained intermittent. She also noted a reduced exercise tolerance. She drove back from [facility] the other day and was visiting with her sister yesterday when she had more constant chest pain. This prompted her to present to [facility]. Here, ECG was nonacute. Troponin was normal. A CTA of the chest was performed to rule out PE and was negative for PE but incidentally did show a small pericardial effusion. She has been placed on culture seen and we have been asked to evaluate. She has not yet had pain relief.   Inpatient Medications    . atorvastatin  40 mg Oral Daily  . colchicine  0.6 mg Oral BID  . heparin  5,000  Units Subcutaneous Q8H  . insulin aspart  0-5 Units Subcutaneous QHS  . insulin aspart  0-9 Units Subcutaneous TID WC  . ketorolac  15 mg Intravenous Once  . metoprolol tartrate  25 mg Oral BID  . venlafaxine XR  150 mg Oral Q breakfast    Family History    Family History  Problem Relation Age of Onset  . Hypertension Mother   . Heart disease Mother   . Diabetes Mellitus I Sister     Social History    Social History   Social History  . Marital status: Divorced    Spouse name: N/A    . Number of children: N/A  . Years of education: N/A   Occupational History  . Not on file.   Social History Main Topics  . Smoking status: Never Smoker  . Smokeless tobacco: Never Used  . Alcohol use No  . Drug use: No  . Sexual activity: Not on file   Other Topics Concern  . Not on file   Social History Narrative   Lives in [place], [place].  Does not routinely exercise.     Review of Systems    General:  No chills, fever, night sweats or weight changes.  Cardiovascular:  +++ Pleuritic and positional chest pain and tenderness.rest of her chronic +++ dyspnea on exertion, recent LUE edema, no orthopnea, palpitations, paroxysmal nocturnal dyspnea. Dermatological: No rash, lesions/masses Respiratory: No cough, dyspnea Urologic: No hematuria, dysuria Abdominal:   No nausea, vomiting, diarrhea, bright red blood per rectum, melena, or hematemesis Neurologic:  Currently c/o headache - says it's bc she is hungry.  No visual changes, wkns, changes in mental status. All other systems reviewed and are otherwise negative except as noted above.  Physical Exam    Blood pressure 132/81, pulse 65, temperature 97.7 F (36.5 C), temperature source Oral, resp. rate 19, height 5\' 2"  (1.575 m), weight 189 lb 8 oz (86 kg), SpO2 97 %.  General: Pleasant, NAD Psych: Normal affect. Neuro: Alert and oriented X 3. Moves all extremities spontaneously. HEENT: Normal  Neck: Supple without bruits or JVD. Lungs:  Resp regular and unlabored, CTA. Heart: RRR no s3, s4, or murmurs.  No rub.  Chest wall is tender to palpation along left sternal border.  Chest pain was worse with the action of sitting up. Abdomen: Soft, non-tender, non-distended, BS + x 4.  Extremities: No clubbing, cyanosis or edema. DP/PT/Radials 2+ and equal bilaterally.  Labs    Troponin [facility](Point of Care Test)  Recent Labs  08/24/16 2333  TROPIPOC 0.00    Recent Labs  08/25/16 0025 08/25/16 0755  TROPONINI <0.03 <0.03    Lab Results  Component Value Date   WBC 4.4 08/24/2016   HGB 10.3 (L) 08/24/2016   HCT 33.0 (L) 08/24/2016   MCV 91.2 08/24/2016   PLT 245 08/24/2016     Recent Labs Lab 08/24/16 1720  NA 140  K 4.1  CL 106  CO2 29  BUN 15  CREATININE 0.90  CALCIUM 9.0  GLUCOSE 113*   Lab Results  Component Value Date   CHOL 140 08/24/2016   HDL 53 08/24/2016   LDLCALC 50 08/24/2016   TRIG 184 (H) 08/24/2016    Radiology Studies    Dg Chest 2 View  Result Date: 08/24/2016 CLINICAL DATA:  60 year old female with chest pain and shortness of breath EXAM: CHEST  2 VIEW COMPARISON:  None. FINDINGS: Two views of the chest do not demonstrate focal consolidation. There  is no pleural effusion or pneumothorax. There is moderate cardiomegaly with mild central vascular prominence, compatible with mild congestion. Left pectoral dual lead pacemaker device. No acute osseous pathology identified. Right upper quadrant cholecystectomy clips. IMPRESSION: Moderate cardiomegaly with mild vascular congestion. No focal consolidation. Electronically Signed   By: Elgie Collard M.D.   On: 08/24/2016 18:23   Ct Angio Chest Pe W And/or Wo Contrast  Result Date: 08/24/2016 CLINICAL DATA:  Acute onset of shortness of breath and bilateral lower leg swelling. Initial encounter. EXAM: CT ANGIOGRAPHY CHEST WITH CONTRAST TECHNIQUE: Multidetector CT imaging of the chest was performed using the standard protocol during bolus administration of intravenous contrast. Multiplanar CT image reconstructions and MIPs were obtained to evaluate the vascular anatomy. CONTRAST:  80 mL of Isovue 370 IV contrast COMPARISON:  Chest radiograph performed earlier today at 5:47 p.m. FINDINGS: There is no evidence of pulmonary embolus. The lungs are essentially clear bilaterally. There is no evidence of significant focal consolidation, pleural effusion or pneumothorax. No masses are identified; no abnormal focal contrast enhancement is seen. The  heart is enlarged, with a small pericardial effusion. Pacemaker leads are noted ending at the right atrium and right ventricle, with the pacemaker at the left chest wall. No mediastinal lymphadenopathy is seen. The great vessels are grossly unremarkable in appearance. No axillary lymphadenopathy is seen. The visualized portions of the thyroid gland are unremarkable in appearance. The visualized portions of the liver and spleen are unremarkable. The visualized portions of the pancreas, stomach, adrenal glands and left kidney are within normal limits. The patient is status post cholecystectomy, with clips noted at the gallbladder fossa. An apparent 3.2 cm right renal cyst is noted. No acute osseous abnormalities are seen. Review of the MIP images confirms the above findings. IMPRESSION: 1. No evidence of pulmonary embolus. 2. Lungs clear bilaterally. 3. Cardiomegaly, with a small pericardial effusion. 4. Right renal cyst noted. Electronically Signed   By: Roanna Raider M.D.   On: 08/24/2016 23:20    ECG & Cardiac Imaging    A paced, 67, ant twi - no old ecg for comparison.  Assessment & Plan    1.  Precordial chest pain and tenderness: Patient presented on September 4 with a two-week history of intermittent rest and exertional precordial chest pain and tenderness. This has been worse with deep breathing, position changes, and palpitation. She was incidentally noted to have a small pericardial effusion on CT performed in the ED yesterday. Troponin is negative so far. CT was negative for pulmonary embolus. She does have a history of pericarditis in the setting of pericardial effusion requiring pericardial window in December 2015, 2 months after having a permanent pacemaker placed. Pain has typical and atypical features for pericarditis though given worsening of pain with palpation and position changes, such as holding the railing sitting up in bed, I suspect muscoloskeletal pain may be playing the largest role  here. Continue culture seen for the time being. We will give 1 dose of IV Toradol. Follow-up echo to better assess effusion. No role for ischemic testing at this time.   2. Essential hypertension: Currently stable.  3. Type 2 diabetes mellitus: Per internal medicine.   4. Hyperlipidemia: Continue statin.   Signed, Nicolasa Ducking, NP 08/25/2016, 1:30 PM   I saw and evaluated the patient along with Mr. Brion Aliment, NP-C. After reviewing all the available data and chart, we discussed the patients laboratory, study & physical findings as well as symptoms in detail. I agree with  his findings, examination as well as impression recommendations as per our discussion.    Very pleasant 60 year old woman with a history of synthetic bradycardia requiring pacemaker placement in 2015. This was then complicated by having a pericardial effusion requiring a paracardial window. She now presents with gradual progression of left-sided central chest pain that is sharp in nature. It is somewhat reproducible on exam, and also reproducible with her pulling herself up in the bed. It is not made better by sitting up or worse but lying down except lying down when she chews shoulder back makes it hurt.  Interestingly, this all began shortly after she was diagnosed with a left upper extremity DVT related to her pacemaker venous access. She apparently has been started on ELIQUIS, making PE very unlikely unless some of her dyspnea symptoms are related to small PEs prior to her initiating ELIQUIS. Regarding this, I would just simply continue her ELIQUIS. As far as her chest pain is concerned, it is definitely precordial, reproducible exam and in my opinion is probably more consistent with costochondritis then pericarditis. However the treatment regimen for this would be the same.  We'll follow the echocardiogram to ensure that his known large pericardial effusion, but otherwise I would consider that a short course of NSAIDs plus  colchicine is not unreasonable treatment course.  We will follow-up to see [place] she is doing tomorrow.    Bryan Lemma, M.D., M.S. Interventional Cardiologist   Pager # [phone] Phone # [phone] [address] [place], [place] [address]

## 2016-08-25 NOTE — Progress Notes (Signed)
PROGRESS NOTE  Maureen Adams  UQJ:[id number] DOB: 10-01-1956 DOA: 08/24/2016 PCP: No primary care provider on file. Outpatient Specialists:  Subjective: Reporting substernal chest pain as well as left-sided pleuritic chest pain. Patient has tenderness over the left pectoralis muscle but she reported this is same pain she had with pericarditis.  Brief Narrative:  Maureen Adams is a 60 y.o. female with medical history significant of pacemaker placement, hypertension, hyperlipidemia, diabetes mellitus, pericardial effusion 20/15 required pericardial fluid drainage, arthritis, possible recent left arm DVT, who presents with chest pain.  Patient reports that she has been having chest pain for 4 days. Her chest pain is located in the left lower chest, constant, sharp, 8 out of 10 in severity, radiating to the left back. It is aggravated by deep breath. It is associated with shortness of breath. No fever or chill. No coughing. She states that she traveled to the [facility] on Saturday by driving and drove back today. She has mild tenderness over right calf area.   Pt state that she was diagnosed with "clot behind her pacemaker due to left arm swelling" two weeks ago". She was started on blood thinner which is started with "Eq or something" two weeks ago by her cardiologist, Dr. Cyndie Mull ([facility], [place]. Tele [phone]). When asked if the blood thinner is Eliquis, she could not confirm it. Currently, she dose not have left arm pain or swelling. Patient denies nausea, vomiting, diarrhea, abdominal pain, symptoms of UTI or unilateral weakness.  Assessment & Plan:   Principal Problem:   Chest pain Active Problems:   Pacemaker   Hypercholesteremia   Diabetes mellitus without complication (HCC)   Arthritis   Chest pain: Etiology is not clear. CT angiogram is negative for PE. No DVT by right LE doppler. No infiltrates by chest x-ray. 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 His pleuritic chest pain nature is consistent with this possibility. Given her significant risk factors including hypertension, hyperlipidemia and diabetes mellitus, will also need to rule out ACS. Pt dose not have JVD, blood pressure normal. No signs of tamponade.  - will place on Tele bed for obs - start colchicine 0.6 mg bid. Will not start NSIADs since pt is taking blood thinner. This plan is discussed with pharmacist on-call. - check CRP and ESR - prn percocet and morphine for pain - cycle CE q6 x3 and repeat her EKG in the am  - continue lipitor  - Risk factor stratification: will check FLP, UDS and A1C  - Cardiology consult requested, 2-D echo pending. -EKG shows atrial paced rhythm with PR interval of 220, no ST-segment changes  Hypercholesteremia: LDL is not on record -continue lipitor -f/u FLP  DM-II: Last A1c not on record. Patient is taking metformin at home. Blood sugar 113 -SSI -Check A1c  Hx of possible left DVT:  -Patient reported that she was recently diagnosed with clot in her upper extremity. -Right lower extremity Doppler was negative for DVT, obtain Doppler of both upper extremities.   DVT prophylaxis: SubQ Heparin Code Status: Full Code Family Communication:  Disposition Plan:  Diet: Diet NPO time specified Except for: Ice Chips, Sips with Meds  Consultants:   Cardiology  Procedures:   None  Antimicrobials:   None   Objective: Vitals:   08/25/16 0030 08/25/16 0104 08/25/16 0431 08/25/16 1134  BP: 111/66 120/71 115/63 125/70  Pulse: 62 67 63 66  Resp: 18 18 20  Temp:  98.2 F (36.8 C) 98 F (36.7 C)   TempSrc:  Oral Oral   SpO2: 95% 100% 97%   Weight:  86 kg (189 lb 8 oz)    Height:  5' 2"  (1.575 m)     No intake or output data in the 24 hours ending 08/25/16 1214 Filed Weights   08/25/16 0104  Weight: 86 kg (189 lb 8 oz)    Examination: General  exam: Appears calm and comfortable  Respiratory system: Clear to auscultation. Respiratory effort normal. Cardiovascular system: S1 & S2 heard, RRR. No JVD, murmurs, rubs, gallops or clicks. No pedal edema. Gastrointestinal system: Abdomen is nondistended, soft and nontender. No organomegaly or masses felt. Normal bowel sounds heard. Central nervous system: Alert and oriented. No focal neurological deficits. Extremities: Symmetric 5 x 5 power. Skin: No rashes, lesions or ulcers Psychiatry: Judgement and insight appear normal. Mood & affect appropriate.   Data Reviewed: I have personally reviewed following labs and imaging studies  CBC:  Recent Labs Lab 08/24/16 1720  WBC 4.4  HGB 10.3*  HCT 33.0*  MCV 91.2  PLT 588   Basic Metabolic Panel:  Recent Labs Lab 08/24/16 1720  NA 140  K 4.1  CL 106  CO2 29  GLUCOSE 113*  BUN 15  CREATININE 0.90  CALCIUM 9.0   GFR: Estimated Creatinine Clearance: 68.5 mL/min (by C-G formula based on SCr of 0.9 mg/dL). Liver Function Tests: No results for input(s): AST, ALT, ALKPHOS, BILITOT, PROT, ALBUMIN in the last 168 hours. No results for input(s): LIPASE, AMYLASE in the last 168 hours. No results for input(s): AMMONIA in the last 168 hours. Coagulation Profile:  Recent Labs Lab 08/25/16 0025  INR 1.11   Cardiac Enzymes:  Recent Labs Lab 08/25/16 0025 08/25/16 0755  TROPONINI <0.03 <0.03   BNP (last 3 results) No results for input(s): PROBNP in the last 8760 hours. HbA1C: No results for input(s): HGBA1C in the last 72 hours. CBG:  Recent Labs Lab 08/25/16 0121 08/25/16 0625 08/25/16 1126  GLUCAP 143* 85 93   Lipid Profile:  Recent Labs  08/24/16 2324  CHOL 140  HDL 53  LDLCALC 50  TRIG 184*  CHOLHDL 2.6   Thyroid Function Tests: No results for input(s): TSH, T4TOTAL, FREET4, T3FREE, THYROIDAB in the last 72 hours. Anemia Panel: No results for input(s): VITAMINB12, FOLATE, FERRITIN, TIBC, IRON,  RETICCTPCT in the last 72 hours. Urine analysis: No results found for: COLORURINE, APPEARANCEUR, LABSPEC, PHURINE, GLUCOSEU, HGBUR, BILIRUBINUR, KETONESUR, PROTEINUR, UROBILINOGEN, NITRITE, LEUKOCYTESUR Sepsis Labs: @LABRCNTIP (procalcitonin:4,lacticidven:4)  )No results found for this or any previous visit (from the past 240 hour(s)).   Invalid input(s): PROCALCITONIN, LACTICACIDVEN   Radiology Studies: Dg Chest 2 View  Result Date: 08/24/2016 CLINICAL DATA:  60 year old female with chest pain and shortness of breath EXAM: CHEST  2 VIEW COMPARISON:  None. FINDINGS: Two views of the chest do not demonstrate focal consolidation. There is no pleural effusion or pneumothorax. There is moderate cardiomegaly with mild central vascular prominence, compatible with mild congestion. Left pectoral dual lead pacemaker device. No acute osseous pathology identified. Right upper quadrant cholecystectomy clips. IMPRESSION: Moderate cardiomegaly with mild vascular congestion. No focal consolidation. Electronically Signed   By: Anner Crete M.D.   On: 08/24/2016 18:23   Ct Angio Chest Pe W And/or Wo Contrast  Result Date: 08/24/2016 CLINICAL DATA:  Acute onset of shortness of breath and bilateral lower leg swelling. Initial encounter. EXAM: CT ANGIOGRAPHY CHEST WITH CONTRAST TECHNIQUE: Multidetector CT  imaging of the chest was performed using the standard protocol during bolus administration of intravenous contrast. Multiplanar CT image reconstructions and MIPs were obtained to evaluate the vascular anatomy. CONTRAST:  80 mL of Isovue 370 IV contrast COMPARISON:  Chest radiograph performed earlier today at 5:47 p.m. FINDINGS: There is no evidence of pulmonary embolus. The lungs are essentially clear bilaterally. There is no evidence of significant focal consolidation, pleural effusion or pneumothorax. No masses are identified; no abnormal focal contrast enhancement is seen. The heart is enlarged, with a small  pericardial effusion. Pacemaker leads are noted ending at the right atrium and right ventricle, with the pacemaker at the left chest wall. No mediastinal lymphadenopathy is seen. The great vessels are grossly unremarkable in appearance. No axillary lymphadenopathy is seen. The visualized portions of the thyroid gland are unremarkable in appearance. The visualized portions of the liver and spleen are unremarkable. The visualized portions of the pancreas, stomach, adrenal glands and left kidney are within normal limits. The patient is status post cholecystectomy, with clips noted at the gallbladder fossa. An apparent 3.2 cm right renal cyst is noted. No acute osseous abnormalities are seen. Review of the MIP images confirms the above findings. IMPRESSION: 1. No evidence of pulmonary embolus. 2. Lungs clear bilaterally. 3. Cardiomegaly, with a small pericardial effusion. 4. Right renal cyst noted. Electronically Signed   By: Garald Balding M.D.   On: 08/24/2016 23:20        Scheduled Meds: . atorvastatin  40 mg Oral Daily  . colchicine  0.6 mg Oral BID  . heparin  5,000 Units Subcutaneous Q8H  . HYDROmorphone      . insulin aspart  0-5 Units Subcutaneous QHS  . insulin aspart  0-9 Units Subcutaneous TID WC  . metoprolol tartrate  25 mg Oral BID  . venlafaxine XR  150 mg Oral Q breakfast   Continuous Infusions:    LOS: 0 days    Time spent: 35 minutes    Toye Rouillard A, MD [facility] Pager [phone]  If 7PM-7AM, please contact night-coverage [email or web address] Password [email or web address] 08/25/2016, 12:14 PM

## 2016-08-25 NOTE — Care Management Obs Status (Signed)
MEDICARE OBSERVATION STATUS NOTIFICATION   Patient Details  Name: Maureen Adams MRN: [id number] Date of Birth: May 04, 1956   Medicare Observation Status Notification Given:  Yes    Darrold SpanWebster, Kush Far[place] Hall, RN 08/25/2016, 4:52 PM

## 2016-08-26 ENCOUNTER — Observation Stay (HOSPITAL_COMMUNITY): Payer: Medicare Other

## 2016-08-26 ENCOUNTER — Observation Stay (HOSPITAL_BASED_OUTPATIENT_CLINIC_OR_DEPARTMENT_OTHER): Payer: Medicare Other

## 2016-08-26 DIAGNOSIS — R079 Chest pain, unspecified: Secondary | ICD-10-CM

## 2016-08-26 DIAGNOSIS — E119 Type 2 diabetes mellitus without complications: Secondary | ICD-10-CM | POA: Diagnosis not present

## 2016-08-26 DIAGNOSIS — R072 Precordial pain: Secondary | ICD-10-CM | POA: Diagnosis not present

## 2016-08-26 DIAGNOSIS — Z950 Presence of cardiac pacemaker: Secondary | ICD-10-CM | POA: Diagnosis not present

## 2016-08-26 DIAGNOSIS — I10 Essential (primary) hypertension: Secondary | ICD-10-CM | POA: Diagnosis not present

## 2016-08-26 LAB — CBC
HCT: 32 % — ABNORMAL LOW (ref 36.0–46.0)
Hemoglobin: 9.9 g/dL — ABNORMAL LOW (ref 12.0–15.0)
MCH: 28 pg (ref 26.0–34.0)
MCHC: 30.9 g/dL (ref 30.0–36.0)
MCV: 90.7 fL (ref 78.0–100.0)
PLATELETS: 216 10*3/uL (ref 150–400)
RBC: 3.53 MIL/uL — ABNORMAL LOW (ref 3.87–5.11)
RDW: 12.6 % (ref 11.5–15.5)
WBC: 4 10*3/uL (ref 4.0–10.5)

## 2016-08-26 LAB — GLUCOSE, CAPILLARY
GLUCOSE-CAPILLARY: 102 mg/dL — AB (ref 65–99)
GLUCOSE-CAPILLARY: 144 mg/dL — AB (ref 65–99)
Glucose-Capillary: 95 mg/dL (ref 65–99)
Glucose-Capillary: 153 mg/dL — ABNORMAL HIGH (ref 65–99)

## 2016-08-26 LAB — BASIC METABOLIC PANEL
Anion gap: 10 (ref 5–15)
BUN: 15 mg/dL (ref 6–20)
CALCIUM: 8.5 mg/dL — AB (ref 8.9–10.3)
CHLORIDE: 102 mmol/L (ref 101–111)
CO2: 27 mmol/L (ref 22–32)
CREATININE: 0.84 mg/dL (ref 0.44–1.00)
GFR calc Af Amer: >60 mL/min (ref >60)
GFR calc non Af Amer: >60 mL/min (ref >60)
GLUCOSE: 98 mg/dL (ref 65–99)
Potassium: 3.9 mmol/L (ref 3.5–5.1)
Sodium: 139 mmol/L (ref 135–145)

## 2016-08-26 LAB — HEMOGLOBIN A1C
HEMOGLOBIN A1C: 5.9 % — AB (ref 4.8–5.6)
Mean Plasma Glucose: 123 mg/dL

## 2016-08-26 LAB — ECHOCARDIOGRAM COMPLETE
HEIGHTINCHES: 62 in
WEIGHTICAEL: 3032 [oz_av]

## 2016-08-26 MED ORDER — APIXABAN 5 MG PO TABS
5.0000 mg | ORAL_TABLET | Freq: Two times a day (BID) | ORAL | Status: DC
  Administered 2016-08-26 – 2016-08-27 (×3): 5 mg via ORAL
  Filled 2016-08-26 (×3): qty 1

## 2016-08-26 MED ORDER — PANTOPRAZOLE SODIUM 40 MG PO TBEC: 40.0000 mg | DELAYED_RELEASE_TABLET | Freq: Every day | ORAL | Status: DC

## 2016-08-26 MED ORDER — TRAMADOL HCL 50 MG PO TABS
50.0000 mg | ORAL_TABLET | Freq: Three times a day (TID) | ORAL | Status: DC
  Administered 2016-08-26 – 2016-08-27 (×4): 50 mg via ORAL
  Filled 2016-08-26 (×4): qty 1

## 2016-08-26 NOTE — Progress Notes (Signed)
Patient Name: Maureen Adams Date of Encounter: 08/26/2016  Hospital Problem List     Principal Problem:   Precordial chest pain Active Problems:   Diabetes mellitus without complication (HCC)   Hypertension   Pacemaker   Hypercholesteremia   Arthritis    Subjective   Chest pain much better today.  She did note improvement following toradol yesterday.    Inpatient Medications    . apixaban  5 mg Oral BID  . atorvastatin  40 mg Oral Daily  . colchicine  0.6 mg Oral BID  . insulin aspart  0-5 Units Subcutaneous QHS  . insulin aspart  0-9 Units Subcutaneous TID WC  . metoprolol tartrate  25 mg Oral BID  . pantoprazole  40 mg Oral Q1200  . traMADol  50 mg Oral TID  . venlafaxine XR  150 mg Oral Q breakfast    Vital Signs    Vitals:   08/25/16 1134 08/25/16 1300 08/25/16 1900 08/26/16 0349  BP: 125/70 132/81 125/69 125/74  Pulse: 66 65 71 71  Resp:  19 18 16   Temp:  97.7 F (36.5 C) 98.7 F (37.1 C) 97.9 F (36.6 C)  TempSrc:  Oral Oral Oral  SpO2:  97% 99% 97%  Weight:      Height:        Intake/Output Summary (Last 24 hours) at 08/26/16 1331 Last data filed at 08/25/16 1800  Gross per 24 hour  Intake              360 ml  Output                0 ml  Net              360 ml   Filed Weights   08/25/16 0104  Weight: 189 lb 8 oz (86 kg)    Physical Exam    GEN: Well nourished, well developed, in no acute distress.  HEENT: normal.  Neck: Supple, no JVD, carotid bruits, or masses. Cardiac: RRR, no murmurs, rubs, or gallops. No clubbing, cyanosis, edema.  Radials/DP/PT 2+ and equal bilaterally.  No chest wall pain today. Respiratory:  Respirations regular and unlabored, clear to auscultation bilaterally. GI: Soft, nontender, nondistended, BS + x 4. MS: no deformity or atrophy. Skin: warm and dry, no rash. Neuro:  Strength and sensation are intact. Psych: Normal affect.  Labs    CBC  Recent Labs  08/24/16 1720 08/26/16 0343  WBC 4.4 4.0  HGB  10.3* 9.9*  HCT 33.0* 32.0*  MCV 91.2 90.7  PLT 245 216   Basic Metabolic Panel  Recent Labs  08/24/16 1720 08/26/16 0343  NA 140 139  K 4.1 3.9  CL 106 102  CO2 29 27  GLUCOSE 113* 98  BUN 15 15  CREATININE 0.90 0.84  CALCIUM 9.0 8.5*   Cardiac Enzymes  Recent Labs  08/25/16 0025 08/25/16 0755 08/25/16 1247  TROPONINI <0.03 <0.03 <0.03   Hemoglobin A1C  Recent Labs  08/25/16 0755  HGBA1C 5.9*   Fasting Lipid Panel  Recent Labs  08/24/16 2324  CHOL 140  HDL 53  LDLCALC 50  TRIG 184*  CHOLHDL 2.6    Telemetry    A paced.  Radiology    Dg Chest 2 View  Result Date: 08/24/2016 CLINICAL DATA:  60 year old female with chest pain and shortness of breath EXAM: CHEST  2 VIEW COMPARISON:  None. FINDINGS: Two views of the chest do not demonstrate focal consolidation. There is  no pleural effusion or pneumothorax. There is moderate cardiomegaly with mild central vascular prominence, compatible with mild congestion. Left pectoral dual lead pacemaker device. No acute osseous pathology identified. Right upper quadrant cholecystectomy clips. IMPRESSION: Moderate cardiomegaly with mild vascular congestion. No focal consolidation. Electronically Signed   By: Elgie Collard M.D.   On: 08/24/2016 18:23   Ct Angio Chest Pe W And/or Wo Contrast  Result Date: 08/24/2016 CLINICAL DATA:  Acute onset of shortness of breath and bilateral lower leg swelling. Initial encounter. EXAM: CT ANGIOGRAPHY CHEST WITH CONTRAST TECHNIQUE: Multidetector CT imaging of the chest was performed using the standard protocol during bolus administration of intravenous contrast. Multiplanar CT image reconstructions and MIPs were obtained to evaluate the vascular anatomy. CONTRAST:  80 mL of Isovue 370 IV contrast COMPARISON:  Chest radiograph performed earlier today at 5:47 p.m. FINDINGS: There is no evidence of pulmonary embolus. The lungs are essentially clear bilaterally. There is no evidence of  significant focal consolidation, pleural effusion or pneumothorax. No masses are identified; no abnormal focal contrast enhancement is seen. The heart is enlarged, with a small pericardial effusion. Pacemaker leads are noted ending at the right atrium and right ventricle, with the pacemaker at the left chest wall. No mediastinal lymphadenopathy is seen. The great vessels are grossly unremarkable in appearance. No axillary lymphadenopathy is seen. The visualized portions of the thyroid gland are unremarkable in appearance. The visualized portions of the liver and spleen are unremarkable. The visualized portions of the pancreas, stomach, adrenal glands and left kidney are within normal limits. The patient is status post cholecystectomy, with clips noted at the gallbladder fossa. An apparent 3.2 cm right renal cyst is noted. No acute osseous abnormalities are seen. Review of the MIP images confirms the above findings. IMPRESSION: 1. No evidence of pulmonary embolus. 2. Lungs clear bilaterally. 3. Cardiomegaly, with a small pericardial effusion. 4. Right renal cyst noted. Electronically Signed   By: Roanna Raider M.D.   On: 08/24/2016 23:20    Patient Summary     60 year old female with a history of symptomatic bradycardia status post permanent pacemaker in 2015 complicated by pericardial effusion req window 2 months later, who was recently dx with LUE DVT and presented 9/4 with a 2 wk h/o chest pain.   Assessment & Plan    1.  Precordial chest pain and tenderness: Patient presented on September 4 with a two-week history of intermittent rest and exertional precordial chest pain and tenderness. This has been worse with deep breathing, position changes, and palpitation. She was incidentally noted to have a small pericardial effusion on CT performed in the ED yesterday. Troponin is negative so far. CT was negative for pulmonary embolus. She does have a history of pericarditis in the setting of pericardial  effusion requiring pericardial window in December 2015, 2 months after having a permanent pacemaker placed. Pain had typical and atypical features for pericarditis though given worsening of pain with palpation and position changes, such as holding the railing sitting up in bed.  We treated with a dose of toradol on 9/5, and she did note improvement.  She is pain free this AM.  I suspect muscoloskeletal pain may be playing the largest role here. Cont colchicine.  Avoiding NSAID in setting of eliquis rx for LUE DVT.  We will review echo - doubt we'll see a large effusion.  Can likely go home on prolonged course of colchicine - 1 months minimum.  No role for ischemic testing at this time.  Can f/u with primary cardiologist in [place].  2. Essential hypertension: Currently stable.  3. Type 2 diabetes mellitus: Per internal medicine.   4. Hyperlipidemia: Continue statin.   Signed, Nicolasa Ducking NP   I have seen, examined and evaluated the patient this AM along with Mr. Brion Aliment, NP-c.  After reviewing all the available data and chart, we discussed the patients laboratory, study & physical findings as well as symptoms in detail. I agree with his findings, examination as well as impression recommendations as per our discussion.    Her pain seems to be somewhat improved after the Toradol. Currently getting scheduled Ultram as well. Overall her chest pain has improved. Echocardiogram apparently has been done, but not yet read. Again I suspect this is probably more musculoskeletal than truly related to pericarditis. However since she is feeling better on the culture seen, would recommend completing at least a month of colchicine. Can use when necessary high-dose NSAIDs, but would prefer not to have standing NSAIDs in setting of her being on Eliquis as well. Using Ultram for chest pain relief is also helpful.  She has a cardiologist in [place] that she can follow-up with. Would ensure that a  copy of the echocardiogram report is with the discharge summary.   We'll wait for the results of the echocardiogram, but likely sign off for now.    Bryan Lemma, M.D., M.S. Interventional Cardiologist   Pager # [phone] Phone # [phone] [address] [place], [place] [address]

## 2016-08-26 NOTE — Progress Notes (Signed)
  Echocardiogram 2D Echocardiogram has been performed.  Leta JunglingCooper, Glorya Bartley M 08/26/2016, 12:53 PM

## 2016-08-26 NOTE — Progress Notes (Signed)
PROGRESS NOTE    Maureen Adams  YNW:[id number] DOB: Dec 15, 1956 DOA: 08/24/2016 PCP: Lucy Antigua I, DO    Brief Narrative:  Maureen Adams a 60 y.o.femalewith medical history significant of pacemaker placement, hypertension, hyperlipidemia, diabetes mellitus, pericardial effusion 20/15 required pericardial fluid drainage, arthritis, possible recent left arm DVT, who presents with chest pain.  Patient reports that she has been having chest pain for 4 days. Her chest pain islocated in the left lower chest, constant, sharp, 8 out of 10 in severity, radiating to the left back. It is aggravated by deep breath. It is associated with shortness of breath. No fever or chill. No coughing. She states that she traveled to the [facility] on Saturday by driving and drove back today. She has mild tenderness over right calf area.   Pt state that she was diagnosed with "clot behind her pacemaker due to left arm swelling" two weeks ago". She was started on blood thinner which is started with "Eq or something" two weeks ago by her cardiologist, Dr. Cyndie Mull ([facility], [place]. Tele [phone]). When asked if the blood thinner is Eliquis, she could not confirm it. Currently, she dose not have left arm pain or swelling. Patient denies nausea, vomiting, diarrhea, abdominal pain, symptoms of UTI or unilateral weakness.   Assessment & Plan:   Principal Problem:   Chest pain Active Problems:   Pacemaker   Hypercholesteremia   Diabetes mellitus without complication (HCC)   Arthritis   Hypertension   Chest pain:Etiology is not clear. CT angiogram is negative for PE. No DVT by right LE doppler. Noinfiltrates by chest x-ray. 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 His pleuritic chest pain nature is consistent with this possibility. Given her significant risk factors including hypertension, hyperlipidemia and  diabetes mellitus, will also need to rule out ACS. Pt dose not have JVD, blood pressure normal. No signsof tamponade. Patient's chest pain is reproducible on palpation. Cardiac enzymes negative 3. 2-D echo is pending. Continue current dose of colchicine 0.6 mg twice daily for the next month. Will place on scheduled Ultram 50 mg 3 times daily. - CRP within normal limits of 0.9. and ESR within normal limits at 20. - prn percocet and morphine for pain. - continue lipitor  - Risk factor stratification: LDL was 50. Patient on a statin. Hemoglobin A1c was 5.9. UDS was positive for opiates however patient was given opioids during this hospitalization.  -EKG shows atrial paced rhythm with PR interval of 220, no ST-segment changes. - Patient has been seen in consultation by cardiology who are following along. Patient's chest pain somewhat improved after IV Toradol 1 dose. 2-D echo is pending. Per cardiology will recommend continuing colchicine 1 month as well as continued scheduled Ultram. Patient will likely need to follow-up with her cardiologist in the outpatient setting.  Hypercholesteremia:  LDL was 50 on 08/24/2016. Continue Lipitor. Outpatient follow-up.  Well-controlled DM-II: Hemoglobin A1c 5.9 on 08/25/2016. Continue to hold oral hypoglycemic agents. Sliding scale insulin.   Hx of recent left UE DVT: -Patient reported that she was recently diagnosed with clot in her upper extremity. -Right lower extremity Doppler was negative for DVT, obtain Doppler of both upper extremities.    DVT prophylaxis: eliquis Code Status: Full Family Communication: Updated patient. No family at bedside. Disposition Plan: Home when medically stable. Hopefully tomorrow.   Consultants:   Cardiology: Dr. Ellyn Hack 08/25/2016  Procedures:   2-D echo pending 08/26/2016  Chest  x-ray 08/24/2016  CT angiogram chest 08/24/2016  Right lower extremity venous duplex negative 08/24/2016  Antimicrobials:    None   Subjective: Patient states chest pain improving. No shortness of breath.  Objective: Vitals:   08/25/16 1134 08/25/16 1300 08/25/16 1900 08/26/16 0349  BP: 125/70 132/81 125/69 125/74  Pulse: 66 65 71 71  Resp:  19 18 16   Temp:  97.7 F (36.5 C) 98.7 F (37.1 C) 97.9 F (36.6 C)  TempSrc:  Oral Oral Oral  SpO2:  97% 99% 97%  Weight:      Height:        Intake/Output Summary (Last 24 hours) at 08/26/16 1258 Last data filed at 08/25/16 1800  Gross per 24 hour  Intake              360 ml  Output                0 ml  Net              360 ml   Filed Weights   08/25/16 0104  Weight: 86 kg (189 lb 8 oz)    Examination:  General exam: Appears calm and comfortable  Respiratory system: Clear to auscultation. Respiratory effort normal. Cardiovascular system: S1 & S2 heard, RRR. No JVD, murmurs, rubs, gallops or clicks. No pedal edema. Chest wall tender to palpation. Gastrointestinal system: Abdomen is nondistended, soft and nontender. No organomegaly or masses felt. Normal bowel sounds heard. Central nervous system: Alert and oriented. No focal neurological deficits. Extremities: Symmetric 5 x 5 power. Skin: No rashes, lesions or ulcers Psychiatry: Judgement and insight appear normal. Mood & affect appropriate.     Data Reviewed: I have personally reviewed following labs and imaging studies  CBC:  Recent Labs Lab 08/24/16 1720 08/26/16 0343  WBC 4.4 4.0  HGB 10.3* 9.9*  HCT 33.0* 32.0*  MCV 91.2 90.7  PLT 245 492   Basic Metabolic Panel:  Recent Labs Lab 08/24/16 1720 08/26/16 0343  NA 140 139  K 4.1 3.9  CL 106 102  CO2 29 27  GLUCOSE 113* 98  BUN 15 15  CREATININE 0.90 0.84  CALCIUM 9.0 8.5*   GFR: Estimated Creatinine Clearance: 73.4 mL/min (by C-G formula based on SCr of 0.84 mg/dL). Liver Function Tests: No results for input(s): AST, ALT, ALKPHOS, BILITOT, PROT, ALBUMIN in the last 168 hours. No results for input(s): LIPASE, AMYLASE  in the last 168 hours. No results for input(s): AMMONIA in the last 168 hours. Coagulation Profile:  Recent Labs Lab 08/25/16 0025  INR 1.11   Cardiac Enzymes:  Recent Labs Lab 08/25/16 0025 08/25/16 0755 08/25/16 1247  TROPONINI <0.03 <0.03 <0.03   BNP (last 3 results) No results for input(s): PROBNP in the last 8760 hours. HbA1C:  Recent Labs  08/25/16 0755  HGBA1C 5.9*   CBG:  Recent Labs Lab 08/25/16 1126 08/25/16 1629 08/25/16 1941 08/26/16 0617 08/26/16 1134  GLUCAP 93 185* 104* 102* 95   Lipid Profile:  Recent Labs  08/24/16 2324  CHOL 140  HDL 53  LDLCALC 50  TRIG 184*  CHOLHDL 2.6   Thyroid Function Tests: No results for input(s): TSH, T4TOTAL, FREET4, T3FREE, THYROIDAB in the last 72 hours. Anemia Panel: No results for input(s): VITAMINB12, FOLATE, FERRITIN, TIBC, IRON, RETICCTPCT in the last 72 hours. Sepsis Labs: No results for input(s): PROCALCITON, LATICACIDVEN in the last 168 hours.  No results found for this or any previous visit (from the past 240  hour(s)).       Radiology Studies: Dg Chest 2 View  Result Date: 08/24/2016 CLINICAL DATA:  60 year old female with chest pain and shortness of breath EXAM: CHEST  2 VIEW COMPARISON:  None. FINDINGS: Two views of the chest do not demonstrate focal consolidation. There is no pleural effusion or pneumothorax. There is moderate cardiomegaly with mild central vascular prominence, compatible with mild congestion. Left pectoral dual lead pacemaker device. No acute osseous pathology identified. Right upper quadrant cholecystectomy clips. IMPRESSION: Moderate cardiomegaly with mild vascular congestion. No focal consolidation. Electronically Signed   By: Anner Crete M.D.   On: 08/24/2016 18:23   Ct Angio Chest Pe W And/or Wo Contrast  Result Date: 08/24/2016 CLINICAL DATA:  Acute onset of shortness of breath and bilateral lower leg swelling. Initial encounter. EXAM: CT ANGIOGRAPHY CHEST WITH  CONTRAST TECHNIQUE: Multidetector CT imaging of the chest was performed using the standard protocol during bolus administration of intravenous contrast. Multiplanar CT image reconstructions and MIPs were obtained to evaluate the vascular anatomy. CONTRAST:  80 mL of Isovue 370 IV contrast COMPARISON:  Chest radiograph performed earlier today at 5:47 p.m. FINDINGS: There is no evidence of pulmonary embolus. The lungs are essentially clear bilaterally. There is no evidence of significant focal consolidation, pleural effusion or pneumothorax. No masses are identified; no abnormal focal contrast enhancement is seen. The heart is enlarged, with a small pericardial effusion. Pacemaker leads are noted ending at the right atrium and right ventricle, with the pacemaker at the left chest wall. No mediastinal lymphadenopathy is seen. The great vessels are grossly unremarkable in appearance. No axillary lymphadenopathy is seen. The visualized portions of the thyroid gland are unremarkable in appearance. The visualized portions of the liver and spleen are unremarkable. The visualized portions of the pancreas, stomach, adrenal glands and left kidney are within normal limits. The patient is status post cholecystectomy, with clips noted at the gallbladder fossa. An apparent 3.2 cm right renal cyst is noted. No acute osseous abnormalities are seen. Review of the MIP images confirms the above findings. IMPRESSION: 1. No evidence of pulmonary embolus. 2. Lungs clear bilaterally. 3. Cardiomegaly, with a small pericardial effusion. 4. Right renal cyst noted. Electronically Signed   By: Garald Balding M.D.   On: 08/24/2016 23:20        Scheduled Meds: . apixaban  5 mg Oral BID  . atorvastatin  40 mg Oral Daily  . colchicine  0.6 mg Oral BID  . insulin aspart  0-5 Units Subcutaneous QHS  . insulin aspart  0-9 Units Subcutaneous TID WC  . metoprolol tartrate  25 mg Oral BID  . pantoprazole  40 mg Oral Q1200  . traMADol  50  mg Oral TID  . venlafaxine XR  150 mg Oral Q breakfast   Continuous Infusions:    LOS: 0 days    Time spent: 82 minutes    Khadijah Mastrianni, MD [facility] Pager [phone]  If 7PM-7AM, please contact night-coverage [email or web address] Password [email or web address] 08/26/2016, 12:58 PM

## 2016-08-27 ENCOUNTER — Observation Stay (HOSPITAL_BASED_OUTPATIENT_CLINIC_OR_DEPARTMENT_OTHER): Payer: Medicare Other

## 2016-08-27 DIAGNOSIS — R072 Precordial pain: Secondary | ICD-10-CM | POA: Diagnosis not present

## 2016-08-27 DIAGNOSIS — R079 Chest pain, unspecified: Secondary | ICD-10-CM

## 2016-08-27 DIAGNOSIS — I10 Essential (primary) hypertension: Secondary | ICD-10-CM | POA: Diagnosis not present

## 2016-08-27 DIAGNOSIS — M79609 Pain in unspecified limb: Secondary | ICD-10-CM | POA: Diagnosis not present

## 2016-08-27 DIAGNOSIS — E119 Type 2 diabetes mellitus without complications: Secondary | ICD-10-CM | POA: Diagnosis not present

## 2016-08-27 LAB — BASIC METABOLIC PANEL
ANION GAP: 7 (ref 5–15)
BUN: 13 mg/dL (ref 6–20)
CHLORIDE: 106 mmol/L (ref 101–111)
CO2: 26 mmol/L (ref 22–32)
Calcium: 8.7 mg/dL — ABNORMAL LOW (ref 8.9–10.3)
Creatinine, Ser: 0.81 mg/dL (ref 0.44–1.00)
GFR calc Af Amer: >60 mL/min (ref >60)
Glucose, Bld: 147 mg/dL — ABNORMAL HIGH (ref 65–99)
POTASSIUM: 4 mmol/L (ref 3.5–5.1)
SODIUM: 139 mmol/L (ref 135–145)

## 2016-08-27 LAB — CBC
HCT: 32.1 % — ABNORMAL LOW (ref 36.0–46.0)
HEMOGLOBIN: 10.2 g/dL — AB (ref 12.0–15.0)
MCH: 28.7 pg (ref 26.0–34.0)
MCHC: 31.8 g/dL (ref 30.0–36.0)
MCV: 90.2 fL (ref 78.0–100.0)
PLATELETS: 220 10*3/uL (ref 150–400)
RBC: 3.56 MIL/uL — AB (ref 3.87–5.11)
RDW: 12.5 % (ref 11.5–15.5)
WBC: 4.2 10*3/uL (ref 4.0–10.5)

## 2016-08-27 LAB — GLUCOSE, CAPILLARY: GLUCOSE-CAPILLARY: 119 mg/dL — AB (ref 65–99)

## 2016-08-27 MED ORDER — PANTOPRAZOLE SODIUM 40 MG PO TBEC: 40.0000 mg | DELAYED_RELEASE_TABLET | Freq: Every day | ORAL | 0 refills | Status: AC

## 2016-08-27 MED ORDER — TRAMADOL HCL 50 MG PO TABS: 50.0000 mg | ORAL_TABLET | Freq: Four times a day (QID) | ORAL | 0 refills | Status: AC | PRN

## 2016-08-27 MED ORDER — COLCHICINE 0.6 MG PO TABS: 0.6000 mg | ORAL_TABLET | Freq: Two times a day (BID) | ORAL | 0 refills | Status: AC

## 2016-08-27 NOTE — Discharge Summary (Signed)
Physician Discharge Summary  Maureen Adams MGQ:[id number] DOB: 11/15/1956 DOA: 08/24/2016  PCP: Maureen Antigua I, DO  Admit date: 08/24/2016 Discharge date: 08/27/2016  Time spent: 60 minutes  Recommendations for Outpatient Follow-up:  1. Follow-up with, Dr Maureen Adams, cardiology in 1-2 weeks. On follow-up patient chest pain will need to be reassessed.  2. On follow-up with cardiology, continuation of patient's anticoagulation will also need to be reassessed per cardiologist, Dr. Rosalita Adams, as repeat bilateral upper extremity Dopplers done during this hospitalization was positive for superficial vein thrombosis involving a segment of the cephalic vein in the left shoulder, otherwise was negative for DVT in the bilateral upper extremities.   Discharge Diagnoses:  Principal Problem:   Precordial chest pain Active Problems:   Pacemaker   Hypercholesteremia   Diabetes mellitus without complication ([facility])   Arthritis   Hypertension   Discharge Condition: Stable and improved  Diet recommendation: Heart healthy  Filed Weights   08/25/16 0104  Weight: 86 kg (189 lb 8 oz)    History of present illness:  Per Dr Maureen Adams is a 60 y.o. female with medical history significant of pacemaker placement, hypertension, hyperlipidemia, diabetes mellitus, pericardial effusion 20/15 required pericardial fluid drainage, arthritis, possible recent left arm DVT, who presented with chest pain.  Patient reported that she had been having chest pain for 4 days. Her chest pain was located in the left lower chest, constant, sharp, 8 out of 10 in severity, radiating to the left back. It was aggravated by deep breath. It was associated with shortness of breath. No fever or chill. No coughing. She stated that she traveled to [place] [facility] [facility] on Saturday by driving and drove back on the day of admission. She has mild tenderness over right calf area.   Pt stated that she was diagnosed with "clot behind her  pacemaker due to left arm swelling" two weeks ago". She was started on blood thinner which was started with "Eq or something" two weeks ago by her cardiologist, Dr. Cyndie Adams [facility], [facility]. Tele [phone]). When asked if the blood thinner is Eliquis, she could not confirm it. Currently, she dose not have left arm pain or swelling. Patient denied nausea, vomiting, diarrhea, abdominal pain, symptoms of UTI or unilateral weakness.  ED Course: pt was found to have negative troponin, WBC 4.4, temperature normal, no tachycardia, no tachypnea, oxygen saturation 94% on room air, electrolytes and renal function okay. Chest x-ray showed vascular congestion, but no infiltration. CT angiogram is negative for PE, but showed small pericardial effusion and right renal cyst. Right LE doppler is negative for DVT. Pt is placed on tele bed for obs.  Hospital Course:  Chest pain:Etiology is not clear. ?? Musculoskeletal plus or minus pericarditis. CT angiogram was negative for PE. No DVT by right LE doppler. Noinfiltrates by chest x-ray. 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 His pleuritic chest pain nature was consistent with this possibility. Given her significant risk factors including hypertension, hyperlipidemia and diabetes mellitus, will also need to rule out ACS. Pt did not have JVD, blood pressure normal. No signsof tamponade. Patient's chest pain was reproducible on palpation. Cardiac enzymes negative 3. 2-D echo which was done on 08/26/2016 had a EF of 55-60% with no wall motion abnormalities and trivial, free-flowing pericardial effusion was identified circumferential to the heart. The fluid had no internal echoes. Cardiology was consulted and followed the patient throughout the hospitalization. Patient was placed on colchicine  0.6 mg twice daily as well as scheduled Ultram. Patient improved clinically throughout the  hospitalization. - CRP within normal limits of 0.9. and ESR within normal limits at 20. - prn percocet and morphine for pain. - continued on  lipitor  - Risk factor stratification: LDL was 50. Patient on a statin. Hemoglobin A1c was 5.9. UDS was positive for opiates however patient was given opioids during this hospitalization.  -EKG showed atrial paced rhythm with PR interval of 220, no ST-segment changes. - Patient's chest pain improved after IV Toradol 1 dose. Colchicine 0.6 mg twice daily and scheduled Ultram. It was recommended per cardiology to continue colchicine 0.6 mg twice daily 1 month. Patient will be discharged home in stable and improved condition and is to follow-up with her cardiologist in the outpatient setting.   Hypercholesteremia:  LDL was 50 on 08/24/2016. Continued on home regimen of Lipitor.   Well-controlled DM-II: Hemoglobin A1c 5.9 on 08/25/2016. Patient's oral hypoglycemic agents were held throughout the hospitalization and patient was maintained on a sliding scale insulin.  Hx of recent left UE DVT: -Patient reported that she was recently diagnosed with clot in her upper extremity. -Right lower extremity Doppler was negative for DVT, Doppler of both upper extremities, was negative for DVT or superficial vein thrombosis in the right upper extremity, evidence of superficial vein thrombosis involving segment of the cephalic vein in the left shoulder. No evidence of DVT involving the left upper extremity. Due to patient's recent history of DVT in the left upper extremity patient was maintained on a home regimen of anticoagulation will follow-up with her cardiologist in the outpatient setting.    Procedures:  2-D echo  08/26/2016  Chest x-ray 08/24/2016  CT angiogram chest 08/24/2016  Right lower extremity venous duplex negative 08/24/2016   Bilateral upper extremity venous duplex--- negative for DVT or superficial thrombus in the right upper extremity.  Evidence of superficial vein thrombosis involving the segment of the cephalic vein in the left shoulder. No evidence of deep venous thrombosis involving the left upper extremity.--08/27/2016   Consultations:  Cardiology: Dr. Ellyn Hack 08/25/2016  Discharge Exam: Vitals:   08/27/16 0410 08/27/16 1020  BP: 123/63   Pulse: 64 67  Resp: 18   Temp: 98 F (36.7 C)     General: NAD Cardiovascular: RRR. Chest wall significantly less TTP. Respiratory: CTAB  Discharge Instructions   Discharge Instructions    Diet - low sodium heart healthy    Complete by:  As directed   Discharge instructions    Complete by:  As directed   Follow up with Cardiologist, Dr [facility]ureen [facility] in 1-2 weeks.   Increase activity slowly    Complete by:  As directed     Current Discharge Medication List    START taking these medications   Details  colchicine 0.6 MG tablet Take 1 tablet (0.6 mg total) by mouth 2 (two) times daily. Qty: 60 tablet, Refills: 0    pantoprazole (PROTONIX) 40 MG tablet Take 1 tablet (40 mg total) by mouth daily at 12 noon. Qty: 30 tablet, Refills: 0      CONTINUE these medications which have CHANGED   Details  traMADol (ULTRAM) 50 MG tablet Take 1 tablet (50 mg total) by mouth every 6 (six) hours as needed for moderate pain. Take 1 tablet 3 times daily x 4 days, then every 6 hours as needed previously. Qty: 15 tablet, Refills: 0      CONTINUE these medications which have NOT CHANGED  Details  apixaban (ELIQUIS) 5 MG TABS tablet Take 5 mg by mouth 2 (two) times daily.    atorvastatin (LIPITOR) 40 MG tablet Take 40 mg by mouth daily.    cyclobenzaprine (FLEXERIL) 10 MG tablet Take 10 mg by mouth 3 (three) times daily as needed for muscle spasms.    metFORMIN (GLUCOPHAGE) 500 MG tablet Take 500 mg by mouth 2 (two) times daily with a meal.    metoprolol tartrate (LOPRESSOR) 25 MG tablet Take 25 mg by mouth 2 (two) times daily.    venlafaxine XR (EFFEXOR-XR) 150 MG 24 hr capsule  Take 150 mg by mouth daily with breakfast.       Allergies  Allergen Reactions  . Banana     Throat closing  . Latex     rash  . Seldane [Terfenadine]     Rash    Follow-up Information    WALLACE, NOAH I, DO. Schedule an appointment as soon as possible for a visit in 2 week(s).   Specialty:  Family Medicine Contact information: [address] [place] [address] [phone]        Delanna Notice, MD. Schedule an appointment as soon as possible for a visit in 2 week(s).   Specialty:  Internal Medicine Why:  f/u in 1-2 weeks. Contact information: [address] [place] [address] [phone]            The results of significant diagnostics from this hospitalization (including imaging, microbiology, ancillary and laboratory) are listed below for reference.    Significant Diagnostic Studies: Dg Chest 2 View  Result Date: 08/24/2016 CLINICAL DATA:  60 year old female with chest pain and shortness of breath EXAM: CHEST  2 VIEW COMPARISON:  None. FINDINGS: Two views of the chest do not demonstrate focal consolidation. There is no pleural effusion or pneumothorax. There is moderate cardiomegaly with mild central vascular prominence, compatible with mild congestion. Left pectoral dual lead pacemaker device. No acute osseous pathology identified. Right upper quadrant cholecystectomy clips. IMPRESSION: Moderate cardiomegaly with mild vascular congestion. No focal consolidation. Electronically Signed   By: Anner Crete M.D.   On: 08/24/2016 18:23   Ct Angio Chest Pe W And/or Wo Contrast  Result Date: 08/24/2016 CLINICAL DATA:  Acute onset of shortness of breath and bilateral lower leg swelling. Initial encounter. EXAM: CT ANGIOGRAPHY CHEST WITH CONTRAST TECHNIQUE: Multidetector CT imaging of the chest was performed using the standard protocol during bolus administration of intravenous contrast. Multiplanar CT image reconstructions and MIPs were obtained to evaluate the  vascular anatomy. CONTRAST:  80 mL of Isovue 370 IV contrast COMPARISON:  Chest radiograph performed earlier today at 5:47 p.m. FINDINGS: There is no evidence of pulmonary embolus. The lungs are essentially clear bilaterally. There is no evidence of significant focal consolidation, pleural effusion or pneumothorax. No masses are identified; no abnormal focal contrast enhancement is seen. The heart is enlarged, with a small pericardial effusion. Pacemaker leads are noted ending at the right atrium and right ventricle, with the pacemaker at the left chest wall. No mediastinal lymphadenopathy is seen. The great vessels are grossly unremarkable in appearance. No axillary lymphadenopathy is seen. The visualized portions of the thyroid gland are unremarkable in appearance. The visualized portions of the liver and spleen are unremarkable. The visualized portions of the pancreas, stomach, adrenal glands and left kidney are within normal limits. The patient is status post cholecystectomy, with clips noted at the gallbladder fossa. An apparent 3.2 cm right renal cyst is noted. No  acute osseous abnormalities are seen. Review of the MIP images confirms the above findings. IMPRESSION: 1. No evidence of pulmonary embolus. 2. Lungs clear bilaterally. 3. Cardiomegaly, with a small pericardial effusion. 4. Right renal cyst noted. Electronically Signed   By: Garald Balding M.D.   On: 08/24/2016 23:20    Microbiology: No results found for this or any previous visit (from the past 240 hour(s)).   Labs: Basic Metabolic Panel:  Recent Labs Lab 08/24/16 1720 08/26/16 0343 08/27/16 0210  NA 140 139 139  K 4.1 3.9 4.0  CL 106 102 106  CO2 _0 GLUCOSE 113* 98 147*  BUN _1 CREATININE 0.90 0.84 0.81  CALCIUM 9.0 8.5* 8.7*   Liver Function Tests: No results for input(s): AST, ALT, ALKPHOS, BILITOT, PROT, ALBUMIN in the last 168 hours. No results for input(s): LIPASE, AMYLASE in the last 168 hours. No  results for input(s): AMMONIA in the last 168 hours. CBC:  Recent Labs Lab 08/24/16 1720 08/26/16 0343 08/27/16 0210  WBC 4.4 4.0 4.2  HGB 10.3* 9.9* 10.2*  HCT 33.0* 32.0* 32.1*  MCV 91.2 90.7 90.2  PLT 245 216 220   Cardiac Enzymes:  Recent Labs Lab 08/25/16 0025 08/25/16 0755 08/25/16 1247  TROPONINI <0.03 <0.03 <0.03   BNP: BNP (last 3 results) No results for input(s): BNP in the last 8760 hours.  ProBNP (last 3 results) No results for input(s): PROBNP in the last 8760 hours.  CBG:  Recent Labs Lab 08/26/16 0617 08/26/16 1134 08/26/16 1650 08/26/16 2024 08/27/16 0605  GLUCAP 102* 95 153* 144* 119*       Signed:  THOMPSON,DANIEL MD.  [facility] 08/27/2016, 12:01 PM

## 2016-08-27 NOTE — Progress Notes (Signed)
**  Preliminary report by tech**  Bilateral upper extremity venous duplex completed. No evidence of deep vein or superficial thrombosis involving the right upper extremity. There is evidence of superficial vein thrombosis involving a segment of the cephalic vein in the left shoulder. There is no evidence of deep vein thrombosis involving the left upper extremity.  08/27/16 9:51 AM [facility] RVT

## 2016-08-27 NOTE — Progress Notes (Addendum)
Patient Name: [facility]en [facility] Date of Encounter: 08/27/2016  Hospital Problem List     Principal Problem:   Precordial chest pain Active Problems:   Diabetes mellitus without complication ([facility])   Hypertension   Pacemaker   Hypercholesteremia   Arthritis  Subjective   No chest pain or sob.  Eager to go home.  Inpatient Medications    . apixaban  5 mg Oral BID  . atorvastatin  40 mg Oral Daily  . colchicine  0.6 mg Oral BID  . insulin aspart  0-5 Units Subcutaneous QHS  . insulin aspart  0-9 Units Subcutaneous TID WC  . metoprolol tartrate  25 mg Oral BID  . pantoprazole  40 mg Oral Q1200  . traMADol  50 mg Oral TID  . venlafaxine XR  150 mg Oral Q breakfast    Vital Signs    Vitals:   08/26/16 0349 08/26/16 1711 08/26/16 2000 08/27/16 0410  BP: 125/74 128/76 126/66 123/63  Pulse: 71 75  64  Resp: 16 18 16 18   Temp: 97.9 F (36.6 C) 97.6 F (36.4 C) 98.4 F (36.9 C) 98 F (36.7 C)  TempSrc: Oral Oral Oral Oral  SpO2: 97% 99% 97% 99%  Weight:      Height:       No intake or output data in the 24 hours ending 08/27/16 0934 Filed Weights   08/25/16 0104  Weight: 189 lb 8 oz (86 kg)    Physical Exam    GEN: Well nourished, well developed, in no acute distress.  HEENT: normal.  Neck: Supple, no JVD, carotid bruits, or masses. Cardiac: RRR, no murmurs, rubs, or gallops. No clubbing, cyanosis, edema.  Radials/DP/PT 2+ and equal bilaterally.  No chest wall pain today. Respiratory:  Respirations regular and unlabored, clear to auscultation bilaterally. GI: Soft, nontender, nondistended, BS + x 4. MS: no deformity or atrophy. Skin: warm and dry, no rash. Neuro:  Strength and sensation are intact. Psych: Normal affect.  Labs    CBC  Recent Labs  08/26/16 0343 08/27/16 0210  WBC 4.0 4.2  HGB 9.9* 10.2*  HCT 32.0* 32.1*  MCV 90.7 90.2  PLT 216 220   Basic Metabolic Panel  Recent Labs  08/26/16 0343 08/27/16 0210  NA 139 139  K 3.9 4.0  CL  102 106  CO2 27 26  GLUCOSE 98 147*  BUN 15 13  CREATININE 0.84 0.81  CALCIUM 8.5* 8.7*   Cardiac Enzymes  Recent Labs  08/25/16 0025 08/25/16 0755 08/25/16 1247  TROPONINI <0.03 <0.03 <0.03   Hemoglobin A1C  Recent Labs  08/25/16 0755  HGBA1C 5.9*   Fasting Lipid Panel  Recent Labs  08/24/16 2324  CHOL 140  HDL 53  LDLCALC 50  TRIG 184*  CHOLHDL 2.6    Telemetry    A paced.  Radiology    No results found.  Patient Summary     60 year old female with a history of symptomatic bradycardia status post permanent pacemaker in 2015 complicated by pericardial effusion req window 2 months later, who was recently dx with LUE DVT and presented 9/4 with a 2 wk h/o chest pain. CT suggested mild to mod pericardial effusion.  Echo 9/6 with triv effusion.  Chest pain improved on colchicine.  Assessment & Plan    1.  Precordial chest pain and tenderness: Patient presented on September 4 with a two-week history of intermittent rest and exertional precordial chest pain and tenderness. This has been worse with deep  breathing, position changes, and palpitation. She was incidentally noted to have a small pericardial effusion on CT performed in the ED yesterday. Troponin is negative so far. CT was negative for pulmonary embolus. She does have a history of pericarditis in the setting of pericardial effusion requiring pericardial window in December 2015, 2 months after having a permanent pacemaker placed. Pain had typical and atypical features for pericarditis though given worsening of pain with palpation and position changes, such as holding the railing sitting up in bed.  We treated with a dose of toradol on 9/5, and she did note improvement.  Echo 9/7 showed nl EF with only triv effusion.  She remains c/p free.  I suspect muscoloskeletal pain may be playing the largest role here. Cont colchicine.  Avoiding NSAID in setting of eliquis rx for LUE DVT.  D/C home on prolonged course of  colchicine - 1 month minimum.  No role for ischemic testing at this time.  Can f/u with primary cardiologist in [place].  2. Essential hypertension: Currently stable.  3. Type 2 diabetes mellitus: Per internal medicine.   4. Hyperlipidemia: Continue statin.   Signed, Nicolasa Ducking NP    I have seen, examined and evaluated the patient this AM along with Mr.Berge, NP.  After reviewing all the available data and chart, we discussed the patients laboratory, study & physical findings as well as symptoms in detail. I agree with his findings, examination as well as impression recommendations as per our discussion.    Chest pain feeling much better. Echocardiogram reviewed with trivial pericardial effusion.  Review with recommendations noted above. See yesterday's for details as well.    Bryan Lemma, M.D., M.S. Interventional Cardiologist   Pager # [phone] Phone # [phone] [address] [place], [place] [address]

## 2016-08-27 NOTE — Progress Notes (Signed)
[facility]is[facility] to be D/C'd Home per MD order. Discussed with the patient and all questions fully answered.    Medication List    TAKE these medications   atorvastatin 40 MG tablet Commonly known as:  LIPITOR Take 40 mg by mouth daily.   colchicine 0.6 MG tablet Take 1 tablet (0.6 mg total) by mouth 2 (two) times daily.   cyclobenzaprine 10 MG tablet Commonly known as:  FLEXERIL Take 10 mg by mouth 3 (three) times daily as needed for muscle spasms.   ELIQUIS 5 MG Tabs tablet Generic drug:  apixaban Take 5 mg by mouth 2 (two) times daily.   metFORMIN 500 MG tablet Commonly known as:  GLUCOPHAGE Take 500 mg by mouth 2 (two) times daily with a meal.   metoprolol tartrate 25 MG tablet Commonly known as:  LOPRESSOR Take 25 mg by mouth 2 (two) times daily.   pantoprazole 40 MG tablet Commonly known as:  PROTONIX Take 1 tablet (40 mg total) by mouth daily at 12 noon.   traMADol 50 MG tablet Commonly known as:  ULTRAM Take 1 tablet (50 mg total) by mouth every 6 (six) hours as needed for moderate pain. Take 1 tablet 3 times daily x 4 days, then every 6 hours as needed previously. What changed:  additional instructions   venlafaxine XR 150 MG 24 hr capsule Commonly known as:  EFFEXOR-XR Take 150 mg by mouth daily with breakfast.       VVS, Skin clean, dry and intact without evidence of skin break down, no evidence of skin tears noted.  IV catheter discontinued intact. Site without signs and symptoms of complications. Dressing and pressure applied.  An After Visit Summary was printed and given to the patient.  Patient escorted via WC, and D/C home via private auto.  [facility]ure[facility], [facility]  08/27/2016 3:26 PM

## 2016-08-31 MED ORDER — IOPAMIDOL (ISOVUE-370) INJECTION 76%
80.0000 mL | Freq: Once | INTRAVENOUS | Status: AC | PRN
  Administered 2016-08-24: 80 mL via INTRAVENOUS

## 2018-07-04 ENCOUNTER — Encounter: Payer: Self-pay | Admitting: "Endocrinology

## 2018-08-23 ENCOUNTER — Ambulatory Visit: Payer: Self-pay | Admitting: "Endocrinology

## 2018-10-05 ENCOUNTER — Ambulatory Visit: Payer: Self-pay | Admitting: "Endocrinology

## 2018-10-06 ENCOUNTER — Encounter: Payer: Self-pay | Admitting: "Endocrinology
